# Patient Record
Sex: Male | Born: 1970 | Race: White | Hispanic: Refuse to answer | Marital: Single | Smoking: Former smoker
Health system: Southern US, Community
[De-identification: ages and names within clinical notes are randomized; demographics above are authoritative.]

## PROBLEM LIST (undated history)

## (undated) DIAGNOSIS — C61 Malignant neoplasm of prostate: Secondary | ICD-10-CM

## (undated) DIAGNOSIS — M1990 Unspecified osteoarthritis, unspecified site: Secondary | ICD-10-CM

## (undated) DIAGNOSIS — F1221 Cannabis dependence, in remission: Secondary | ICD-10-CM

## (undated) DIAGNOSIS — Z8489 Family history of other specified conditions: Secondary | ICD-10-CM

## (undated) DIAGNOSIS — J302 Other seasonal allergic rhinitis: Secondary | ICD-10-CM

## (undated) DIAGNOSIS — E7800 Pure hypercholesterolemia, unspecified: Secondary | ICD-10-CM

## (undated) DIAGNOSIS — I861 Scrotal varices: Secondary | ICD-10-CM

## (undated) DIAGNOSIS — F419 Anxiety disorder, unspecified: Secondary | ICD-10-CM

## (undated) HISTORY — DX: Anxiety disorder, unspecified: F41.9

## (undated) HISTORY — DX: Cannabis dependence, in remission: F12.21

## (undated) HISTORY — PX: ANAL FISSURE REPAIR: SHX2312

## (undated) HISTORY — DX: Scrotal varices: I86.1

## (undated) HISTORY — PX: HERNIA REPAIR: SHX51

## (undated) HISTORY — DX: Pure hypercholesterolemia, unspecified: E78.00

---

## 2000-08-18 ENCOUNTER — Ambulatory Visit (HOSPITAL_COMMUNITY): Admission: RE | Admit: 2000-08-18 | Discharge: 2000-08-18 | Payer: Self-pay | Admitting: Family Medicine

## 2000-08-18 ENCOUNTER — Encounter: Payer: Self-pay | Admitting: Family Medicine

## 2001-07-21 ENCOUNTER — Ambulatory Visit (HOSPITAL_BASED_OUTPATIENT_CLINIC_OR_DEPARTMENT_OTHER): Admission: RE | Admit: 2001-07-21 | Discharge: 2001-07-21 | Payer: Self-pay | Admitting: General Surgery

## 2003-08-05 ENCOUNTER — Emergency Department (HOSPITAL_COMMUNITY): Admission: AD | Admit: 2003-08-05 | Discharge: 2003-08-05 | Payer: Self-pay | Admitting: Family Medicine

## 2004-03-15 ENCOUNTER — Emergency Department (HOSPITAL_COMMUNITY): Admission: AD | Admit: 2004-03-15 | Discharge: 2004-03-15 | Payer: Self-pay | Admitting: Family Medicine

## 2004-12-27 ENCOUNTER — Emergency Department (HOSPITAL_COMMUNITY): Admission: EM | Admit: 2004-12-27 | Discharge: 2004-12-27 | Payer: Self-pay | Admitting: Family Medicine

## 2006-08-18 ENCOUNTER — Encounter: Admission: RE | Admit: 2006-08-18 | Discharge: 2006-08-18 | Payer: Self-pay | Admitting: Orthopedic Surgery

## 2006-09-20 ENCOUNTER — Ambulatory Visit (HOSPITAL_BASED_OUTPATIENT_CLINIC_OR_DEPARTMENT_OTHER): Admission: RE | Admit: 2006-09-20 | Discharge: 2006-09-20 | Payer: Self-pay | Admitting: General Surgery

## 2007-12-22 ENCOUNTER — Emergency Department (HOSPITAL_COMMUNITY): Admission: EM | Admit: 2007-12-22 | Discharge: 2007-12-22 | Payer: Self-pay | Admitting: Emergency Medicine

## 2010-09-19 NOTE — Op Note (Signed)
NAMEROBLEY, Terry Jenkins                   ACCOUNT NO.:  [id number]   MEDICAL RECORD NO.:  [id number]          PATIENT TYPE:  AMB   LOCATION:  DSC                          FACILITY:  [facility]   PHYSICIAN:  Cherylynn Ridges, M.D.    DATE OF BIRTH:  [birth year]   DATE OF PROCEDURE:  09/20/2006  DATE OF DISCHARGE:                               OPERATIVE REPORT   PREOPERATIVE DIAGNOSIS:  Left inguinal hernia.   POSTOPERATIVE DIAGNOSIS:  Left indirect inguinal hernia.   PROCEDURE:  Left inguinal hernia repair with mesh.   SURGEON:  Dr. Lindie Spruce   ASSISTANT:  None.   ANESTHESIA:  General with a laryngeal airway.   ESTIMATED BLOOD LOSS:  Less than 20 mL.   COMPLICATIONS:  None.   CONDITION:  Stable.   FINDINGS:  The patient had a small indirect hernia which was repaired,  small piece of mesh was implanted.   INDICATIONS FOR OPERATION:  The patient is a 40 year old who comes in  with a symptomatic left inguinal hernia for repair.   OPERATION:  The patient was taken to the operating room, placed on table  in supine position.  After an adequate general laryngeal airway  anesthetic was administered, he was prepped and draped in usual sterile  manner exposing the left inguinal area.  It should be noted at this time  that the patient had shaved himself prior to coming in for surgery.   Left transverse curvilinear incision about 5 cm long was taken down into  the subcutaneous tissue. Subcutaneous vein was ligated with 3-0 Vicryl  ties.  We took it down to and through Scarpa's fascia to the external  oblique fascia.  We split the fascia along its fibers through the  superficial ring and mobilized spermatic cord at the pubic tubercle.  Penrose drains used to mobilize and then subsequently we dissected out  the hernia sac which was in the anterior medial aspect of the spermatic  cord.  We were able to dissect out a 4 cm x 2 cm indirect sac which we  subsequently ligated x2 at its base with 0 Ethibond  suture with suture  ligature sutures.  Once this was done we implanted an antibiotic soaked  piece of polypropylene mesh measuring 2 x 4 cm in size, an oval piece  attaching it to the conjoined tendon anterior medially and the reflected  portion of inguinal ligament inferolaterally.  This was done with an 0  Prolene suture.  Once this was done we closed the external oblique  fascia on top of the spermatic cord with intact ilioinguinal nerve using  running 3-0 Vicryl stitch.  Scarpa's fascia was reapproximated using  interrupted 3-0 Vicryl  sutures.  We injected half percent Marcaine without epinephrine at the  site including a local block at the anterior-superior iliac spine, total  of 16 mL used.  We then closed the skin using running subcuticular  stitch of 4-0 Monocryl.  Sterile dressing was applied.  All needle,  sponge counts and instrument counts were correct.      Cherylynn Ridges, M.D.  Electronically Signed     JOW/MEDQ  D:  09/20/2006  T:  09/20/2006  Job:  [id number]   cc:   Caryn Bee L. Little, M.D.

## 2010-09-19 NOTE — Op Note (Signed)
[place]  Patient:    Terry Jenkins, Terry Jenkins Visit Number: [id number] MRN: [id number]          Service Type: DSU Location: [facility] Attending Physician:  Cherylynn Ridges Dictated by:   Jimmye Norman, M.D. Proc. Date: 07/21/01 Admit Date:  07/21/2001                             Operative Report  PREOPERATIVE DIAGNOSIS:  Posterior anal fissure with sentinel tag.  POSTOPERATIVE DIAGNOSIS:  Posterior anal fissure with sentinel tag with mucosal tear in the anterior lateral position on the left side at approximately 1 oclock.  PROCEDURE: 1. Examination under anesthesia. 2. Lateral sphincterotomy. 3. Excision of anal fissure.  SURGEON:  Jimmye Norman, M.D.  ASSISTANT:  None.  ANESTHESIA:  General with laryngeal airway.  ESTIMATED BLOOD LOSS:  20 to 30 cc.  COMPLICATIONS:  None.  CONDITION ON DISCHARGE:  Stable.  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  He now comes in for a lateral sphincterotomy.  FINDINGS:  The patient had a posterior anal fissure with some chronic scarring not into the sphincter which we were able to excise with minimal difficulty. The lateral sphincterotomy was okay.  He also had a new mucosal tear at approximately 1 to 2 oclock position with anterior being 12 oclock on the left side.  DESCRIPTION OF PROCEDURE:  The patient was taken to the operating room, placed on the table initially in the supine position.  After an adequate laryngeal airway anesthetic was administered, he was placed in lithotomy and then prepped with Betadine.  Using an anoscope, we were able to visualize the anal fissure which we subsequently incised with a #15 blade, and cauterized to obtain hemostasis. We reapproximated the freshened edges of the mucosa using a running locking stitch of 3-0 Vicryl out to the skin level.  There was  no further scarring, and we left the sphincter intact at that point.  On the left lateral side, we performed a longitudinal incision across the internal sphincter.  We subsequently isolated the sphincter using a hemostat clamp and then transected it using electrocautery.  We then reapproximated this mucosal partially up to sort of the dentate line using a running locking suture of 3-0 chromic.  If I used 3-0 Vicryl for the other stitch, actually we used 3-0 chromic for both repairs.  We irrigated with saline.  There was minimal bleeding.  Cautery was used sparingly.  We subsequently injected 0.25% Marcaine with epinephrine into the soft centric mucosal area, and then we placed Dibucaine cream soaked Gelfoam packing into the anus.  Dressing was applied.  All needle counts, sponge counts, and instrument counts were correct.  He was taken to the recovery room in stable condition. Dictated by:   Jimmye Norman, M.D. Attending Physician:  Cherylynn Ridges DD:  07/21/01 TD:  07/22/01 Job: [id number]

## 2012-08-10 ENCOUNTER — Encounter (HOSPITAL_BASED_OUTPATIENT_CLINIC_OR_DEPARTMENT_OTHER): Payer: Self-pay | Admitting: Emergency Medicine

## 2012-08-10 DIAGNOSIS — S0100XA Unspecified open wound of scalp, initial encounter: Secondary | ICD-10-CM | POA: Insufficient documentation

## 2012-08-10 DIAGNOSIS — Z23 Encounter for immunization: Secondary | ICD-10-CM | POA: Insufficient documentation

## 2012-08-10 DIAGNOSIS — Y9389 Activity, other specified: Secondary | ICD-10-CM | POA: Insufficient documentation

## 2012-08-10 DIAGNOSIS — Y9289 Other specified places as the place of occurrence of the external cause: Secondary | ICD-10-CM | POA: Insufficient documentation

## 2012-08-10 DIAGNOSIS — W208XXA Other cause of strike by thrown, projected or falling object, initial encounter: Secondary | ICD-10-CM | POA: Insufficient documentation

## 2012-08-10 DIAGNOSIS — Y990 Civilian activity done for income or pay: Secondary | ICD-10-CM | POA: Insufficient documentation

## 2012-08-10 DIAGNOSIS — F17200 Nicotine dependence, unspecified, uncomplicated: Secondary | ICD-10-CM | POA: Insufficient documentation

## 2012-08-10 NOTE — ED Notes (Signed)
Pt was hit in back of head by metal door at work. Pt denies LOC. Pt has small laceration to back of head

## 2012-08-11 ENCOUNTER — Emergency Department (HOSPITAL_BASED_OUTPATIENT_CLINIC_OR_DEPARTMENT_OTHER)
Admission: EM | Admit: 2012-08-11 | Discharge: 2012-08-11 | Disposition: A | Discharge status: Home / Self Care | Attending: Emergency Medicine | Admitting: Emergency Medicine

## 2012-08-11 DIAGNOSIS — S0101XA Laceration without foreign body of scalp, initial encounter: Secondary | ICD-10-CM

## 2012-08-11 MED ORDER — TETANUS-DIPHTH-ACELL PERTUSSIS 5-2.5-18.5 LF-MCG/0.5 IM SUSP
0.5000 mL | Freq: Once | INTRAMUSCULAR | Status: AC
  Administered 2012-08-11: 0.5 mL via INTRAMUSCULAR

## 2012-08-11 MED ORDER — TETANUS-DIPHTH-ACELL PERTUSSIS 5-2.5-18.5 LF-MCG/0.5 IM SUSP
INTRAMUSCULAR | Status: AC
  Filled 2012-08-11: qty 0.5

## 2012-08-11 MED ORDER — ACETAMINOPHEN 325 MG PO TABS
650.0000 mg | ORAL_TABLET | Freq: Once | ORAL | Status: AC
  Administered 2012-08-11: 650 mg via ORAL
  Filled 2012-08-11: qty 2

## 2012-08-11 NOTE — ED Notes (Signed)
MD at bedside. 

## 2012-08-11 NOTE — ED Provider Notes (Signed)
History     CSN: [id number]  Arrival date & time 08/10/12  2229   First MD Initiated Contact with Patient 08/11/12 0104      Chief Complaint  Patient presents with  . Head Injury    (Consider location/radiation/quality/duration/timing/severity/associated sxs/prior treatment) HPI This is a 42 year old male who was at work just prior to arrival when container door fell onto his head. He has a laceration to the occiput. Bleeding has been controlled with pressure. The wound was cleaned prior to my evaluation by his nurse. There was no loss of consciousness. He has not been nauseated or vomiting. He has a mild headache. He denies neck pain, back pain, chest pain or abdominal pain. He denies extremity injury.   History reviewed. No pertinent past medical history.  Past Surgical History  Procedure Laterality Date  . Hernia repair    . Anal fissure repair      No family history on file.  History  Substance Use Topics  . Smoking status: Current Some Day Smoker  . Smokeless tobacco: Not on file  . Alcohol Use: Yes      Review of Systems  All other systems reviewed and are negative.    Allergies  Ampicillin  Home Medications  No current outpatient prescriptions on file.  BP 140/88  Pulse 55  Temp(Src) 97.8 F (36.6 C) (Oral)  Resp 18  Ht 5\' 9"  (1.753 m)  Wt 165 lb (74.844 kg)  BMI 24.36 kg/m2  SpO2 100%  Physical Exam General: Well-developed, well-nourished male in no acute distress; appearance consistent with age of record HENT: normocephalic, laceration of the occiput without crepitus or deformity Eyes: pupils equal round and reactive to light; extraocular muscles intact Neck: supple Heart: regular rate and rhythm Lungs: clear to auscultation bilaterally Chest: Nontender Abdomen: soft; nondistended; nontender Extremities: No deformity; full range of motion Neurologic: Awake, alert and oriented; motor function intact in all extremities and symmetric; no facial  droop Skin: Warm and dry Psychiatric: Normal mood and affect    ED Course  Procedures (including critical care time)  LACERATION REPAIR Performed by: Ahyan Kreeger L Authorized by: Hanley Seamen Consent: Verbal consent obtained. Risks and benefits: risks, benefits and alternatives were discussed Consent given by: patient Patient identity confirmed: provided demographic data Prepped and Draped in normal sterile fashion Wound explored  Laceration Location: Occiput  Laceration Length: 2 cm  No Foreign Bodies seen or palpated  Anesthesia: none   Irrigation method: syringe Amount of cleaning: standard  Skin closure: Staples  Number of sutures: 3    Patient tolerance: Patient tolerated the procedure well with no immediate complications.    MDM          Hanley Seamen, MD 08/11/12 [phone]

## 2012-08-16 ENCOUNTER — Ambulatory Visit: Payer: Self-pay | Admitting: Sports Medicine

## 2012-08-17 ENCOUNTER — Encounter (HOSPITAL_BASED_OUTPATIENT_CLINIC_OR_DEPARTMENT_OTHER): Payer: Self-pay | Admitting: *Deleted

## 2012-08-17 DIAGNOSIS — F17200 Nicotine dependence, unspecified, uncomplicated: Secondary | ICD-10-CM | POA: Insufficient documentation

## 2012-08-17 DIAGNOSIS — Z4802 Encounter for removal of sutures: Secondary | ICD-10-CM | POA: Insufficient documentation

## 2012-08-17 NOTE — ED Provider Notes (Signed)
History     CSN: [id number]  Arrival date & time 08/17/12  2352   First MD Initiated Contact with Patient 08/17/12 2356      Chief Complaint  Patient presents with  . Suture / Staple Removal    (Consider location/radiation/quality/duration/timing/severity/associated sxs/prior treatment) Patient is a 42 y.o. male presenting with suture removal. The history is provided by the patient.  Suture / Staple Removal  The sutures were placed 7 to 10 days ago. There has been no treatment since the wound repair. Fever duration: none. There has been no drainage from the wound. There is no redness present. There is no swelling present. The pain has no pain. He has no difficulty moving the affected extremity or digit.    History reviewed. No pertinent past medical history.  Past Surgical History  Procedure Laterality Date  . Hernia repair    . Anal fissure repair      History reviewed. No pertinent family history.  History  Substance Use Topics  . Smoking status: Current Some Day Smoker  . Smokeless tobacco: Not on file  . Alcohol Use: Yes      Review of Systems  All other systems reviewed and are negative.    Allergies  Ampicillin  Home Medications  No current outpatient prescriptions on file.  Pulse 70  Resp 16  SpO2 100%  Physical Exam  Constitutional: He is oriented to person, place, and time. He appears well-developed and well-nourished. No distress.  HENT:  Mouth/Throat: Oropharynx is clear and moist.  Wound cdi  Eyes: Conjunctivae are normal. Pupils are equal, round, and reactive to light.  Neck: Normal range of motion. Neck supple.  Cardiovascular: Normal rate and regular rhythm.   Pulmonary/Chest: Effort normal and breath sounds normal.  Abdominal: Soft. Bowel sounds are normal. There is no tenderness.  Musculoskeletal: Normal range of motion.  Neurological: He is alert and oriented to person, place, and time.  Skin: Skin is warm and dry.  Psychiatric: He  has a normal mood and affect.    ED Course  Procedures (including critical care time)  Labs Reviewed - No data to display No results found.   1. Removal of staple       MDM  Staple removal        Charday Capetillo K Shahzain Kiester-Rasch, MD 08/18/12 0000

## 2012-08-17 NOTE — ED Notes (Signed)
Here for staple removal

## 2012-08-18 ENCOUNTER — Emergency Department (HOSPITAL_BASED_OUTPATIENT_CLINIC_OR_DEPARTMENT_OTHER)
Admission: EM | Admit: 2012-08-18 | Discharge: 2012-08-18 | Disposition: A | Discharge status: Home / Self Care | Attending: Emergency Medicine | Admitting: Emergency Medicine

## 2012-08-18 DIAGNOSIS — Z4802 Encounter for removal of sutures: Secondary | ICD-10-CM

## 2012-09-06 ENCOUNTER — Ambulatory Visit: Payer: Self-pay | Admitting: Sports Medicine

## 2012-11-29 ENCOUNTER — Emergency Department
Admission: EM | Admit: 2012-11-29 | Discharge: 2012-11-29 | Disposition: A | Discharge status: Home / Self Care | Payer: Federal, State, Local not specified - PPO | Source: Home / Self Care | Attending: Family Medicine | Admitting: Family Medicine

## 2012-11-29 ENCOUNTER — Encounter: Payer: Self-pay | Admitting: *Deleted

## 2012-11-29 DIAGNOSIS — J029 Acute pharyngitis, unspecified: Secondary | ICD-10-CM

## 2012-11-29 MED ORDER — AZITHROMYCIN 250 MG PO TABS: ORAL_TABLET | ORAL | Status: DC

## 2012-11-29 NOTE — ED Provider Notes (Signed)
  CSN: [id number]     Arrival date & time 11/29/12  1444 History     First MD Initiated Contact with Patient 11/29/12 1511     Chief Complaint  Patient presents with  . Sore Throat  . Fever  . Generalized Body Aches     HPI Comments: Patient awoke yesterday morning with sore throat, myalgias, fatigue, and headache.  This morning he had a fever to 101.  No nasal congestion or cough.   History reviewed. No pertinent past medical history. Past Surgical History  Procedure Laterality Date  . Hernia repair    . Anal fissure repair     Family History  Problem Relation Age of Onset  . Hypertension Mother    History  Substance Use Topics  . Smoking status: Former Games developer  . Smokeless tobacco: Not on file  . Alcohol Use: Yes    Review of Systems + sore throat No cough No pleuritic pain No wheezing No nasal congestion ? post-nasal drainage No sinus pain/pressure No itchy/red eyes No earache No hemoptysis No SOB + fever, + chills + nausea No vomiting No abdominal pain No diarrhea No urinary symptoms No skin rashes + fatigue + myalgias + headache    Allergies  Ampicillin  Home Medications   Current Outpatient Rx  Name  Route  Sig  Dispense  Refill  . azithromycin (ZITHROMAX Z-PAK) 250 MG tablet      Take 2 tabs today; then begin one tab once daily for 4 more days.   6 each   0    BP 130/79  Pulse 76  Temp(Src) 100.4 F (38 C) (Oral)  Resp 18  Wt 162 lb (73.483 kg)  BMI 23.91 kg/m2  SpO2 97% Physical Exam Nursing notes and Vital Signs reviewed. Appearance:  Patient appears healthy, stated age, and in no acute distress Eyes:  Pupils are equal, round, and reactive to light and accomodation.  Extraocular movement is intact.  Conjunctivae are not inflamed  Ears:  Canals normal.  Tympanic membranes normal.  Nose:  Normal turbinates.  No sinus tenderness.   Pharynx:  Pharynx:  Erythematous and slightly swollen without obstruction.  Neck:  Supple.  Tender  enlarged anterior nodes.  Lungs:  Clear to auscultation.  Breath sounds are equal.  Heart:  Regular rate and rhythm without murmurs, rubs, or gallops.  Abdomen:  Nontender without masses or hepatosplenomegaly.  Bowel sounds are present.  No CVA or flank tenderness.  Extremities:  No edema.  No calf tenderness Skin:  No rash present.   ED Course   Procedures  none  Labs Reviewed  POCT RAPID STREP A (OFFICE) negative    1. Acute pharyngitis; ?false negative rapid strep    MDM  Throat culture pending.  Begin a Z-pack. Take Ibuprofen 200mg , 4 tabs every 8 hours with food.  Try warm salt water gargles. Followup with Family Doctor if not improved in one week.   Lattie Haw, MD 12/05/12 [phone]

## 2012-11-29 NOTE — ED Notes (Signed)
Pt c/o sore throat, fever, nausea, body aches, chills and HA x 1 day.

## 2012-11-30 LAB — STREP A DNA PROBE: GASP: NEGATIVE

## 2012-12-01 ENCOUNTER — Telehealth: Payer: Self-pay | Admitting: Emergency Medicine

## 2013-01-18 ENCOUNTER — Encounter (HOSPITAL_BASED_OUTPATIENT_CLINIC_OR_DEPARTMENT_OTHER): Payer: Self-pay | Admitting: Emergency Medicine

## 2013-01-18 ENCOUNTER — Emergency Department (HOSPITAL_BASED_OUTPATIENT_CLINIC_OR_DEPARTMENT_OTHER)
Admission: EM | Admit: 2013-01-18 | Discharge: 2013-01-18 | Disposition: A | Discharge status: Home / Self Care | Payer: Federal, State, Local not specified - PPO | Attending: Emergency Medicine | Admitting: Emergency Medicine

## 2013-01-18 DIAGNOSIS — J40 Bronchitis, not specified as acute or chronic: Secondary | ICD-10-CM | POA: Insufficient documentation

## 2013-01-18 DIAGNOSIS — R110 Nausea: Secondary | ICD-10-CM | POA: Insufficient documentation

## 2013-01-18 DIAGNOSIS — Z792 Long term (current) use of antibiotics: Secondary | ICD-10-CM | POA: Insufficient documentation

## 2013-01-18 DIAGNOSIS — Z87891 Personal history of nicotine dependence: Secondary | ICD-10-CM | POA: Insufficient documentation

## 2013-01-18 DIAGNOSIS — R51 Headache: Secondary | ICD-10-CM | POA: Insufficient documentation

## 2013-01-18 DIAGNOSIS — R197 Diarrhea, unspecified: Secondary | ICD-10-CM | POA: Insufficient documentation

## 2013-01-18 MED ORDER — DOXYCYCLINE HYCLATE 100 MG PO CAPS: 100.0000 mg | ORAL_CAPSULE | Freq: Two times a day (BID) | ORAL | Status: DC

## 2013-01-18 MED ORDER — IBUPROFEN 800 MG PO TABS: 800.0000 mg | ORAL_TABLET | Freq: Three times a day (TID) | ORAL | Status: DC

## 2013-01-18 NOTE — ED Provider Notes (Cosign Needed)
CSN: [id number]     Arrival date & time 01/18/13  1922 History   This chart was scribed for non-physician practitioner working Cherrie Distance, PA-C with Hilario Quarry, MD by Valera Castle, ED scribe. This patient was seen in room MH02/MH02 and the patient's care was started at 7:58 PM.    Chief Complaint  Patient presents with  . Cough    Patient is a 42 y.o. male presenting with cough. The history is provided by the patient. No language interpreter was used.  Cough  HPI Comments: Terry Jenkins is a 42 y.o. male who presents to the Emergency Department complaining of sudden, intermittent, heavy, unproductive cough, onset 2 days ago. He reports first noticing the symptoms while at work, and reports a water-like feeling in his chest. He reports associated mild headaches, nausea, and diarrhea. He states he has not taken any medication PTA. He also reports increased skin sensitivity, and states that this is a normal occurrence when he becomes ill. He states that he is prone to respiratory infections and that these symptoms are similar to his former illnesses. He reports having a negative strep test 1 month ago, and that he had been taking Ibuprofen and antibiotics   History reviewed. No pertinent past medical history. Past Surgical History  Procedure Laterality Date  . Hernia repair    . Anal fissure repair     Family History  Problem Relation Age of Onset  . Hypertension Mother    History  Substance Use Topics  . Smoking status: Former Games developer  . Smokeless tobacco: Not on file  . Alcohol Use: Yes    Review of Systems  Respiratory: Positive for cough.   All other systems reviewed and are negative.    Allergies  Ampicillin  Home Medications   Current Outpatient Rx  Name  Route  Sig  Dispense  Refill  . azithromycin (ZITHROMAX Z-PAK) 250 MG tablet      Take 2 tabs today; then begin one tab once daily for 4 more days.   6 each   0    Triage Vitals: BP 130/70  Pulse 74   Temp(Src) 98.4 F (36.9 C)  Resp 18  Ht 5\' 9"  (1.753 m)  Wt 165 lb (74.844 kg)  BMI 24.36 kg/m2  SpO2 100%  Physical Exam  Nursing note and vitals reviewed. Constitutional: He is oriented to person, place, and time. He appears well-developed and well-nourished. No distress.  HENT:  Head: Normocephalic and atraumatic.  Eyes: EOM are normal.  Neck: Neck supple. No tracheal deviation present.  Cardiovascular: Normal rate.   Pulmonary/Chest: Effort normal. No respiratory distress.  Musculoskeletal: Normal range of motion.  Neurological: He is alert and oriented to person, place, and time.  Skin: Skin is warm and dry.  Psychiatric: He has a normal mood and affect. His behavior is normal.    ED Course  Procedures (including critical care time)  DIAGNOSTIC STUDIES: Oxygen Saturation is 100% on room air, normal by my interpretation.    COORDINATION OF CARE: 7:58 PM-Discussed treatment plan which includes abx and supportive care     Labs Review Labs Reviewed - No data to display Imaging Review No results found.  MDM  Bronchitis   42 year old former smoker who presents with URI - patient with history of same - states usually gets abx - plan to give same - lungs clear here - sats normal - do not suspect CAP.  No chest pain so doubt PE.  I personally performed the services described in this documentation, which was scribed in my presence. The recorded information has been reviewed and is accurate.   Izola Price Marisue Humble, [place] 01/18/13 2043

## 2013-01-18 NOTE — ED Notes (Signed)
Pt c/o cough x 2 days and body aches with diarrhea today.

## 2013-01-18 NOTE — Discharge Instructions (Signed)

## 2013-02-01 NOTE — ED Provider Notes (Signed)
CSN: [id number]     Arrival date & time 01/18/13  1922 History   This chart was scribed for non-physician practitioner working Cherrie Distance, PA-C with Hilario Quarry, MD by Valera Castle, ED scribe. This patient was seen in room MH02/MH02 and the patient's care was started at 7:58 PM.    Chief Complaint  Patient presents with  . Cough    Patient is a 42 y.o. male presenting with cough. The history is provided by the patient. No language interpreter was used.  Cough Cough characteristics:  Non-productive Severity:  Moderate Onset quality:  Sudden Duration:  2 days Timing:  Intermittent Progression:  Worsening Chronicity:  Recurrent Smoker: Quit 09/2003.   Context comment:  Has h/o URI. Associated symptoms: no fever, no rhinorrhea, no shortness of breath, no sinus congestion and no sore throat    HPI Comments: LIVINGSTON DENNER is a 42 y.o. male who presents to the Emergency Department complaining of sudden, intermittent, heavy, unproductive cough, onset 2 days ago. He reports first noticing the symptoms while at work, and reports a water-like feeling in his chest. He reports the cough is worsened by laying down. He reports associated mild headaches, nausea, and diarrhea. He states he has not taken any medication PTA. He also reports increased skin sensitivity, and states that this is a normal occurrence when he becomes ill. He denies taking any medications PTA. He denies anyone around him being ill. He states that he is prone to respiratory infections and that these symptoms are similar to his former illnesses. He reports having a negative strep test 1 month ago, and that he had been taking Ibuprofen and antibiotics. He reports responding well to doxycycline. He denies SOB, chest pain, chronic cough, rhinorrhea, congestion, emesis, sore throat, fever, chills, numbness, or any other associated symptoms. He denies h/o asthma or COPD. He states he is a former smoker quitting on 03/2004. 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   PCP- Dr. Catha Gosselin [facility]   History reviewed. No pertinent past medical history. Past Surgical History  Procedure Laterality Date  . Hernia repair    . Anal fissure repair     Family History  Problem Relation Age of Onset  . Hypertension Mother    History  Substance Use Topics  . Smoking status: Former Games developer  . Smokeless tobacco: Not on file  . Alcohol Use: Yes    Review of Systems  Constitutional: Negative for fever.  HENT: Negative for sore throat and rhinorrhea.   Respiratory: Positive for cough. Negative for shortness of breath.   All other systems reviewed and are negative.    Allergies  Ampicillin  Home Medications   Current Outpatient Rx  Name  Route  Sig  Dispense  Refill  . azithromycin (ZITHROMAX Z-PAK) 250 MG tablet      Take 2 tabs today; then begin one tab once daily for 4 more days.   6 each   0    Triage Vitals: BP 130/70  Pulse 74  Temp(Src) 98.4 F (36.9 C)  Resp 18  Ht 5\' 9"  (1.753 m)  Wt 165 lb (74.844 kg)  BMI 24.36 kg/m2  SpO2 100%  Physical Exam  Nursing note and vitals reviewed. Constitutional: He is oriented to person, place, and time. He appears well-developed and well-nourished. No distress.  HENT:  Head: Normocephalic and atraumatic.  Eyes: EOM are normal.  Neck: Neck supple. No tracheal deviation present.  Cardiovascular: Normal rate, regular rhythm  and normal heart sounds.   Pulmonary/Chest: Effort normal and breath sounds normal. No respiratory distress. He has no wheezes. He has no rales.  Abdominal: Soft. There is no tenderness.  Musculoskeletal: Normal range of motion.  Neurological: He is alert and oriented to person, place, and time.  Skin: Skin is warm and dry.  Psychiatric: He has a normal mood and affect. His behavior is normal.    ED Course  Procedures (including critical care time)  DIAGNOSTIC STUDIES: Oxygen Saturation is 100% on room air, normal by my  interpretation.    COORDINATION OF CARE: 7:58 PM-Discussed treatment plan which includes abx and supportive care     Labs Review Labs Reviewed - No data to display Imaging Review No results found.  MDM  Bronchitis   42 year old former smoker who presents with URI - patient with history of same - states usually gets abx - plan to give same - lungs clear here - sats normal - do not suspect CAP.  No chest pain so doubt PE.      I personally performed the services described in this documentation, which was scribed in my presence. The recorded information has been reviewed and is accurate.   Izola Price Marisue Humble, PA-C 01/18/13 2043   Izola Price. Marisue Humble, [place] 02/05/13 2207

## 2013-02-07 NOTE — ED Provider Notes (Signed)
History/physical exam/procedure(s) were performed by non-physician practitioner and as supervising physician I was immediately available for consultation/collaboration. I have reviewed all notes and am in agreement with care and plan.   Hilario Quarry, MD 02/07/13 [phone]

## 2013-06-06 ENCOUNTER — Ambulatory Visit: Payer: Federal, State, Local not specified - PPO | Attending: Family Medicine | Admitting: Physical Therapy

## 2013-06-06 DIAGNOSIS — M25559 Pain in unspecified hip: Secondary | ICD-10-CM | POA: Insufficient documentation

## 2013-06-06 DIAGNOSIS — IMO0001 Reserved for inherently not codable concepts without codable children: Secondary | ICD-10-CM | POA: Insufficient documentation

## 2013-06-06 DIAGNOSIS — M7150 Other bursitis, not elsewhere classified, unspecified site: Secondary | ICD-10-CM | POA: Insufficient documentation

## 2013-06-06 DIAGNOSIS — M25659 Stiffness of unspecified hip, not elsewhere classified: Secondary | ICD-10-CM | POA: Insufficient documentation

## 2013-06-06 DIAGNOSIS — M6281 Muscle weakness (generalized): Secondary | ICD-10-CM | POA: Insufficient documentation

## 2013-06-06 DIAGNOSIS — R293 Abnormal posture: Secondary | ICD-10-CM | POA: Insufficient documentation

## 2013-06-13 ENCOUNTER — Ambulatory Visit: Payer: Federal, State, Local not specified - PPO | Admitting: Physical Therapy

## 2013-06-27 ENCOUNTER — Ambulatory Visit: Payer: Federal, State, Local not specified - PPO | Admitting: Rehabilitation

## 2013-07-04 ENCOUNTER — Ambulatory Visit (INDEPENDENT_AMBULATORY_CARE_PROVIDER_SITE_OTHER): Payer: Federal, State, Local not specified - PPO | Admitting: Sports Medicine

## 2013-07-04 ENCOUNTER — Encounter: Payer: Self-pay | Admitting: Sports Medicine

## 2013-07-04 VITALS — BP 140/74 | HR 65 | Ht 69.0 in | Wt 161.0 lb

## 2013-07-04 DIAGNOSIS — M76899 Other specified enthesopathies of unspecified lower limb, excluding foot: Secondary | ICD-10-CM

## 2013-07-04 DIAGNOSIS — M7071 Other bursitis of hip, right hip: Secondary | ICD-10-CM | POA: Insufficient documentation

## 2013-07-04 NOTE — Patient Instructions (Signed)
Initial Hamstring Rehab Protocol Hamstring curls: Start with 3 sets of 15 (no weight); Progress by 5 reps every 3 days until you reach 3 sets of 30; After 3 days at 3 sets of 30, add 2lb ankle weight at 3 sets of 10; Increase every 5 days by 5 reps. You may add 2lbs ankle weight once weekly. Hamstring swings- swing leg backwards and curl at the end of the swing. Follow same schedule as above. Hamstring running lunges- running lunge position means no more than 45 degrees of knee flexion and running motion. Follow same schedule as above.  Advanced Hamstring Rehab Protocol Plyometrics: bound and land in running position; 3 sets of 15 level first; then up a rise; then down a rise; only advance when level is easy. No greater than 45 deg knee flexion. Stride drills- 10 x 75 yards; bounding but no more than 45 deg knee flexion. Level for 1 week; then add up hill for 1 week; then add down hill. Hop drills- 15 repetitive hops- 3 sets; compare injured to non injured leg Nordic hamstring exercises- stabilize body kneeling;1 set of 5-10 and progress slowly(Don't attempt if painful) 

## 2013-07-04 NOTE — Assessment & Plan Note (Signed)
Ultrasound guided injection into the ischial bursa as above. Aggressive eccentric hamstring rehabilitation. Over-the-counter NSAIDs as needed, continue chiropractic care. Return to see me in one month.

## 2013-07-04 NOTE — Progress Notes (Signed)
   Subjective:    I'm seeing this patient as a consultation for:  Dr. Hulan Fess, Dr. Johnston Ebbs  CC: Right buttock pain  HPI: This is a very pleasant 43 year old male, he is a power lifter. For the past several months he's noted pain that he localizes in the posterior aspect of his right buttock directly over the ischial tuberosity. Pain is moderate, persistent, without radiation. He only feels it when sitting, and with forceful dead lifts. He denies any other mechanical symptoms, no back pain, no radicular symptoms. He was seen by an orthopedist who recommended an MRI be done first, , he was not very happy with the visit.  Past medical history, Surgical history, Family history not pertinant except as noted below, Social history, Allergies, and medications have been entered into the medical record, reviewed, and no changes needed.   Review of Systems: No headache, visual changes, nausea, vomiting, diarrhea, constipation, dizziness, abdominal pain, skin rash, fevers, chills, night sweats, weight loss, swollen lymph nodes, body aches, joint swelling, muscle aches, chest pain, shortness of breath, mood changes, visual or auditory hallucinations.   Objective:   General: Well Developed, well nourished, and in no acute distress.  Neuro/Psych: Alert and oriented x3, extra-ocular muscles intact, able to move all 4 extremities, sensation grossly intact. Skin: Warm and dry, no rashes noted.  Respiratory: Not using accessory muscles, speaking in full sentences, trachea midline.  Cardiovascular: Pulses palpable, no extremity edema. Abdomen: Does not appear distended. Right Hip: ROM IR: 45 Deg, ER: 45 Deg, Flexion: 120 Deg, Extension: 100 Deg, Abduction: 45 Deg, Adduction: 45 Deg Strength IR: 5/5, ER: 5/5, Flexion: 5/5, Extension: 5/5, Abduction: 5/5, Adduction: 5/5 Pelvic alignment unremarkable to inspection and palpation. Standing hip rotation and gait without trendelenburg sign /  unsteadiness. Greater trochanter without tenderness to palpation. There is moderate tenderness to palpation over the ischial tuberosity. There is no reproduction of pain with resisted knee flexion or hip extension. No tenderness over piriformis. No pain with FABER or FADIR. No SI joint tenderness and normal minimal SI movement.  Procedure: Real-time Ultrasound Guided Injection of right ischial bursa Device: GE Logiq E  Verbal informed consent obtained.  Time-out conducted.  Noted no overlying erythema, induration, or other signs of local infection.  Skin prepped in a sterile fashion.  Local anesthesia: Topical Ethyl chloride.  With sterile technique and under real time ultrasound guidance:  Spinal needle advanced to the ischial tuberosity, bounced off of bone just deep to the hamstring origin, a total of 1 cc Kenalog 40, 4 cc lidocaine injected. Completed without difficulty  Pain immediately resolved suggesting accurate placement of the medication.  Advised to call if fevers/chills, erythema, induration, drainage, or persistent bleeding.  Images permanently stored and available for review in the ultrasound unit.  Impression: Technically successful ultrasound guided injection.  Impression and Recommendations:   This case required medical decision making of moderate complexity.

## 2013-07-10 ENCOUNTER — Ambulatory Visit: Payer: Federal, State, Local not specified - PPO | Admitting: Rehabilitation

## 2013-07-11 ENCOUNTER — Ambulatory Visit: Payer: Federal, State, Local not specified - PPO | Attending: Family Medicine | Admitting: Physical Therapy

## 2013-07-11 DIAGNOSIS — M6281 Muscle weakness (generalized): Secondary | ICD-10-CM | POA: Insufficient documentation

## 2013-07-11 DIAGNOSIS — M25659 Stiffness of unspecified hip, not elsewhere classified: Secondary | ICD-10-CM | POA: Insufficient documentation

## 2013-07-11 DIAGNOSIS — R293 Abnormal posture: Secondary | ICD-10-CM | POA: Insufficient documentation

## 2013-07-11 DIAGNOSIS — M25559 Pain in unspecified hip: Secondary | ICD-10-CM | POA: Insufficient documentation

## 2013-07-11 DIAGNOSIS — M7150 Other bursitis, not elsewhere classified, unspecified site: Secondary | ICD-10-CM | POA: Insufficient documentation

## 2013-08-01 ENCOUNTER — Ambulatory Visit: Payer: Self-pay | Admitting: Sports Medicine

## 2013-08-02 ENCOUNTER — Ambulatory Visit (INDEPENDENT_AMBULATORY_CARE_PROVIDER_SITE_OTHER): Payer: Federal, State, Local not specified - PPO | Admitting: Sports Medicine

## 2013-08-02 ENCOUNTER — Encounter: Payer: Self-pay | Admitting: Sports Medicine

## 2013-08-02 VITALS — BP 133/79 | HR 61 | Ht 69.0 in | Wt 159.0 lb

## 2013-08-02 DIAGNOSIS — M7071 Other bursitis of hip, right hip: Secondary | ICD-10-CM

## 2013-08-02 DIAGNOSIS — M76899 Other specified enthesopathies of unspecified lower limb, excluding foot: Secondary | ICD-10-CM

## 2013-08-02 NOTE — Assessment & Plan Note (Signed)
Resolved with injection and physical therapy. I have recommended avoiding active release therapy directly over his ischial tuberosity. Continue weightlifting but avoid dead lift from the ground, and avoid early rapid acceleration. Return as needed.

## 2013-08-02 NOTE — Progress Notes (Signed)
  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 He had been through some physical therapy as well as active release therapy by his chiropractor but had not improved. Performed an injection into the ischial bursa, and I advised specific rehabilitation exercises. He returns pain-free. He does note that when doing dead lift, if he starts from the floor and accelerates to rapidly from the beginning he has a recurrence of pain. I have advised to start on the right at approximately knee level, and avoiding initial rapid acceleration.  Past medical history, Surgical history, Family history not pertinant except as noted below, Social history, Allergies, and medications have been entered into the medical record, reviewed, and no changes needed.   Review of Systems: No fevers, chills, night sweats, weight loss, chest pain, or shortness of breath.   Objective:    General: Well Developed, well nourished, and in no acute distress.  Neuro: Alert and oriented x3, extra-ocular muscles intact, sensation grossly intact.  HEENT: Normocephalic, atraumatic, pupils equal round reactive to light, neck supple, no masses, no lymphadenopathy, thyroid nonpalpable.  Skin: Warm and dry, no rashes. Cardiac: Regular rate and rhythm, no murmurs rubs or gallops, no lower extremity edema.  Respiratory: Clear to auscultation bilaterally. Not using accessory muscles, speaking in full sentences. Right Hip: ROM IR: 45 Deg, ER: 45 Deg, Flexion: 120 Deg, Extension: 100 Deg, Abduction: 45 Deg, Adduction: 45 Deg Strength IR: 5/5, ER: 5/5, Flexion: 5/5, Extension: 5/5, Abduction: 5/5, Adduction: 5/5 Pelvic alignment unremarkable to inspection and palpation. Standing hip rotation and gait without trendelenburg sign / unsteadiness. Greater trochanter without tenderness to palpation. No tenderness over piriformis. No pain with FABER or FADIR. No SI joint tenderness and normal  minimal SI movement.  Impression and Recommendations:

## 2013-10-24 ENCOUNTER — Emergency Department (INDEPENDENT_AMBULATORY_CARE_PROVIDER_SITE_OTHER): Payer: Federal, State, Local not specified - PPO

## 2013-10-24 ENCOUNTER — Emergency Department
Admission: EM | Admit: 2013-10-24 | Discharge: 2013-10-24 | Disposition: A | Discharge status: Home / Self Care | Payer: Federal, State, Local not specified - PPO | Source: Home / Self Care

## 2013-10-24 ENCOUNTER — Encounter: Payer: Self-pay | Admitting: Emergency Medicine

## 2013-10-24 DIAGNOSIS — S6010XA Contusion of unspecified finger with damage to nail, initial encounter: Secondary | ICD-10-CM

## 2013-10-24 DIAGNOSIS — S6991XA Unspecified injury of right wrist, hand and finger(s), initial encounter: Secondary | ICD-10-CM

## 2013-10-24 DIAGNOSIS — M79609 Pain in unspecified limb: Secondary | ICD-10-CM

## 2013-10-24 DIAGNOSIS — S6000XA Contusion of unspecified finger without damage to nail, initial encounter: Secondary | ICD-10-CM

## 2013-10-24 MED ORDER — HYDROCODONE-ACETAMINOPHEN 7.5-325 MG PO TABS: 1.0000 | ORAL_TABLET | Freq: Three times a day (TID) | ORAL | Status: DC | PRN

## 2013-10-24 MED ORDER — DOXYCYCLINE HYCLATE 100 MG PO TABS: 100.0000 mg | ORAL_TABLET | Freq: Two times a day (BID) | ORAL | Status: AC

## 2013-10-24 NOTE — Discharge Instructions (Signed)
Subungual Hematoma °A subungual hematoma is a pocket of blood that collects under the fingernail or toenail. The pressure created by the blood under the nail can cause pain. °CAUSES  °A subungual hematoma occurs when an injury to the finger or toe causes a blood vessel beneath the nail to break. The injury can occur from a direct blow such as slamming a finger in a door. It can also occur from a repeated injury such as pressure on the foot in a shoe while running. A subungual hematoma is sometimes called runner's toe or tennis toe. °SYMPTOMS  °· Blue or dark blue skin under the nail. °· Pain or throbbing in the injured area. °DIAGNOSIS  °Your caregiver can determine whether you have a subungual hematoma based on your history and a physical exam. If your caregiver thinks you might have a broken (fractured) bone, X-rays may be taken. °TREATMENT  °Hematomas usually go away on their own over time. Your caregiver may make a hole in the nail to drain the blood. Draining the blood is painless and usually provides significant relief from pain and throbbing. The nail usually grows back normally after this procedure. In some cases, the nail may need to be removed. This is done if there is a cut under the nail that requires stitches (sutures). °HOME CARE INSTRUCTIONS  °· Put ice on the injured area. °¨ Put ice in a plastic bag. °¨ Place a towel between your skin and the bag. °¨ Leave the ice on for 15-20 minutes, 03-04 times a day for the first 1 to 2 days. °· Elevate the injured area to help decrease pain and swelling. °· If you were given a bandage, wear it for as long as directed by your caregiver. °· If part of your nail falls off, trim the remaining nail gently. This prevents the nail from catching on something and causing further injury. °· Only take over-the-counter or prescription medicines for pain, discomfort, or fever as directed by your caregiver. °SEEK IMMEDIATE MEDICAL CARE IF:  °· You have redness or swelling  around the nail. °· You have yellowish-white fluid (pus) coming from the nail. °· Your pain is not controlled with medicine. °· You have a fever. °MAKE SURE YOU: °· Understand these instructions. °· Will watch your condition. °· Will get help right away if you are not doing well or get worse. °Document Released: 04/17/2000 Document Revised: 07/13/2011 Document Reviewed: 04/08/2011 °[employer]® Patient Information ©2015 [employer]. This information is not intended to replace advice given to you by your health care provider. Make sure you discuss any questions you have with your health care provider. ° °

## 2013-10-24 NOTE — ED Notes (Signed)
Pt reports that he "smashed" his RT thumb last night. No OTC meds.

## 2013-10-24 NOTE — ED Provider Notes (Signed)
CSN: [id number]     Arrival date & time 10/24/13  0802 History   None    Chief Complaint  Patient presents with  . Finger Injury   (Consider location/radiation/quality/duration/timing/severity/associated sxs/prior Treatment) HPI Pt is a 43 yo male who presents to the clinic after smashing his right thumb with heavy machinary last night. Pain is 8/10, constant and throbbing. He tried to heat up a paperclip and drain himself but just didn't feel like it was working. Not tried anything else.  History reviewed. No pertinent past medical history. Past Surgical History  Procedure Laterality Date  . Hernia repair    . Anal fissure repair     Family History  Problem Relation Age of Onset  . Hypertension Mother    History  Substance Use Topics  . Smoking status: Former Research scientist (life sciences)  . Smokeless tobacco: Not on file  . Alcohol Use: Yes    Review of Systems  All other systems reviewed and are negative.   Allergies  Ampicillin  Home Medications   Prior to Admission medications   Medication Sig Start Date End Date Taking? Authorizing Provider  doxycycline (VIBRA-TABS) 100 MG tablet Take 1 tablet (100 mg total) by mouth 2 (two) times daily. For 10 days. 10/24/13 10/31/13  Donella Stade, PA-C  HYDROcodone-acetaminophen (NORCO) 7.5-325 MG per tablet Take 1 tablet by mouth every 8 (eight) hours as needed. 10/24/13   Jade L Breeback, PA-C   BP 132/86  Pulse 56  Temp(Src) 98.1 F (36.7 C) (Oral)  Resp 16  Ht 5\' 9"  (1.753 m)  Wt 166 lb (75.297 kg)  BMI 24.50 kg/m2  SpO2 99% Physical Exam  Constitutional: He appears well-developed and well-nourished.  Musculoskeletal:       Hands:   ED Course  Procedures (including critical care time) Labs Review Labs Reviewed - No data to display  Imaging Review Dg Finger Thumb Right  10/24/2013   CLINICAL DATA:  43 year old male with thumb pain and bruising after crush injury. Initial encounter.  EXAM: RIGHT THUMB 2+V  COMPARISON:  None.   FINDINGS: Bone mineralization is within normal limits. Joint spaces and alignment in the visible right hand and wrist appear preserved. The first metacarpal and phalanges appear intact. Joint spaces are preserved. No acute fracture identified. No radiopaque foreign body identified.  IMPRESSION: No acute fracture or dislocation identified about the right thumb.   Electronically Signed   By: Lars Pinks M.D.   On: 10/24/2013 08:41     MDM   1. Subungual hematoma of finger, initial encounter   2. Thumbnail injury, right, initial encounter    Xray was negative for any fracture.  Discussed with patient swelling is likely from soft tissue injury.  Due to soft tissue swelling around cuticle and redness. Printed off doxycycline rx and told to use if noticed signs of infection.  Fine tip cautery was used to place a hole in the nail and relieve pressure. Pt did report relief from some pain while in office today.  Pt was given ice pack with instructions to rest for 24 hours. Note for work was given.  Thumb was covered with gauze and compression coban.  norco was given for pain.  Discussed ibuprofen for swelling and inflammation can also help.       Donella Stade, PA-C 10/24/13 [place], PA-C 10/24/13 [phone]

## 2013-10-25 NOTE — ED Provider Notes (Signed)
Agree with exam, assessment, and plan.   Kandra Nicolas, MD 10/25/13 1401

## 2014-07-27 ENCOUNTER — Emergency Department
Admission: EM | Admit: 2014-07-27 | Discharge: 2014-07-27 | Disposition: A | Discharge status: Home / Self Care | Payer: Federal, State, Local not specified - PPO | Source: Home / Self Care | Attending: Emergency Medicine | Admitting: Emergency Medicine

## 2014-07-27 ENCOUNTER — Encounter: Payer: Self-pay | Admitting: Emergency Medicine

## 2014-07-27 DIAGNOSIS — S61412A Laceration without foreign body of left hand, initial encounter: Secondary | ICD-10-CM | POA: Diagnosis not present

## 2014-07-27 DIAGNOSIS — S6992XA Unspecified injury of left wrist, hand and finger(s), initial encounter: Secondary | ICD-10-CM

## 2014-07-27 MED ORDER — SULFAMETHOXAZOLE-TRIMETHOPRIM 800-160 MG PO TABS: 1.0000 | ORAL_TABLET | Freq: Two times a day (BID) | ORAL | Status: DC

## 2014-07-27 NOTE — ED Provider Notes (Signed)
CSN: [id number]     Arrival date & time 07/27/14  1116 History   First MD Initiated Contact with Patient 07/27/14 1124     Chief Complaint  Patient presents with  . Extremity Laceration    HPI Pt states he cut his left hand on a sharp axe blade late last night. . Last tetanus shot x2 years ago.  Minimally painful 3 cm full skin thickness laceration dorsum L hand.. Bleeding stopped last night with direct pressure after he cleaned the wound. . No other associated symptoms.Denies weakness or numbness. Denies head injury, neck pain, chest pain, shortness of breath, abdominal pain, nausea, diarrhea. Denies fever or chills History reviewed. No pertinent past medical history. Past Surgical History  Procedure Laterality Date  . Hernia repair    . Anal fissure repair     Family History  Problem Relation Age of Onset  . Hypertension Mother    History  Substance Use Topics  . Smoking status: Former Research scientist (life sciences)  . Smokeless tobacco: Not on file  . Alcohol Use: Yes    Review of Systems  All other systems reviewed and are negative.   Allergies  Ampicillin  Home Medications   Prior to Admission medications   Medication Sig Start Date End Date Taking? Authorizing Provider  sulfamethoxazole-trimethoprim (SEPTRA DS) 800-160 MG per tablet Take 1 tablet by mouth 2 (two) times daily. X 7 days (antibiotic) 07/27/14   Jacqulyn Cane, MD   BP 145/75 mmHg  Pulse 68  Temp(Src) 97.4 F (36.3 C) (Oral)  SpO2 100% Physical Exam  Constitutional: He is oriented to person, place, and time. He appears well-developed and well-nourished. No distress.  HENT:  Head: Normocephalic and atraumatic.  Eyes: Conjunctivae and EOM are normal. Pupils are equal, round, and reactive to light. No scleral icterus.  Neck: Normal range of motion.  Cardiovascular: Normal rate.   Pulmonary/Chest: Effort normal.  Abdominal: He exhibits no distension.  Musculoskeletal: Normal range of motion.  Dorsum of left hand, there  is a gaping 3 cm straight laceration, extending to the subcutaneous tissues. Tendons, function, range of motion intact. Motor and sensory intact. Neurovascular distally intact. Normal capillary refill distally.  Neurological: He is alert and oriented to person, place, and time.  Skin: Skin is warm.  Psychiatric: He has a normal mood and affect.  Nursing note and vitals reviewed.   3 cm full skin thickness laceration dorsum left hand Tendons and neurovascular intact. No bony tenderness. No foreign body seen ED Course  LACERATION REPAIR Date/Time: 07/27/2014 12:31 PM Performed by: Burnett Harry, DAVID Authorized by: Burnett Harry, DAVID Consent: Verbal consent obtained. Risks and benefits: risks, benefits and alternatives were discussed Consent given by: patient Patient understanding: patient states understanding of the procedure being performed Patient identity confirmed: verbally with patient Time out: Immediately prior to procedure a "time out" was called to verify the correct patient, procedure, equipment, support staff and site/side marked as required. Body area: upper extremity (Dorsum left hand) Laceration length: 3 cm Contaminated: No. Tendon involvement: none Nerve involvement: none Vascular damage: no Anesthesia: local infiltration Local anesthetic: lidocaine 2% without epinephrine Anesthetic total: 3 ml Preparation: Patient was prepped and draped in the usual sterile fashion. Irrigation solution: saline Irrigation method: jet lavage Amount of cleaning: standard Debridement: none Skin closure: 4-0 nylon Number of sutures: 7 Approximation: close Approximation difficulty: simple Dressing: non-adhesive packing strip and gauze roll (And Coban) Patient tolerance: Patient tolerated the procedure well with no immediate complications   Imaging Review No results found.  MDM   1. Laceration of left hand, initial encounter   2. Injury of left hand, initial encounter    No  evidence of bony or tendinous involvement. Although the laceration occurred last night, in my opinion, I feel the benefits of repair outweigh the risks, because of how deep and gaping the wound is. There is no foreign body, no sign of infection. Laceration repaired, see details above. 7 4-0 nylon interrupted sutures, with excellent approximation and hemostasis. Wound aftercare discussed Return in 12-14 days for suture removal, sooner prn. Red flags discussed Antibiotic prescribed. He is allergic to ampicillin, so after discussion with patient, I prescribed Septra DS twice a day 7 days. This will have excellent antibacterial coverage, including coverage for MRSA. No gripping or lifting left hand for 7 days.--He states he may need a note for this. This was indicated in AVS, which was printed and given to patient. He declined any prescription pain meds. Advised Tylenol or ibuprofen if needed for pain. Questions invited and answered. He voiced understanding and agreement.  Jacqulyn Cane, MD 07/27/14 Kathyrn Drown

## 2014-07-27 NOTE — ED Notes (Signed)
Pt states he cut his left hand on an axe blade late last night. . Last tetanus shot x2 years ago.

## 2014-07-27 NOTE — Discharge Instructions (Signed)
Laceration Care, Adult A laceration is a cut or lesion that goes through all layers of the skin and into the tissue just beneath the skin. TREATMENT  Some lacerations may not require closure. Some lacerations may not be able to be closed due to an increased risk of infection. It is important to see your caregiver as soon as possible after an injury to minimize the risk of infection and maximize the opportunity for successful closure. If closure is appropriate, pain medicines may be given, if needed. The wound will be cleaned to help prevent infection. Your caregiver will use stitches (sutures), staples, wound glue (adhesive), or skin adhesive strips to repair the laceration. These tools bring the skin edges together to allow for faster healing and a better cosmetic outcome. However, all wounds will heal with a scar. Once the wound has healed, scarring can be minimized by covering the wound with sunscreen during the day for 1 full year. HOME CARE INSTRUCTIONS  For sutures or staples:  Keep the wound clean and dry.  If you were given a bandage (dressing), you should change it at least once a day. Also, change the dressing if it becomes wet or dirty, or as directed by your caregiver.  Wash the wound with soap and water 2 times a day. Rinse the wound off with water to remove all soap. Pat the wound dry with a clean towel.  After cleaning, apply a thin layer of the antibiotic ointment as recommended by your caregiver. This will help prevent infection and keep the dressing from sticking.  You may shower as usual after the first 24 hours. Do not soak the wound in water until the sutures are removed.  Only take over-the-counter or prescription medicines for pain, discomfort, or fever as directed by your caregiver. Get your sutures removed in 12-14 days  You may need a tetanus shot if:  You cannot remember when you had your last tetanus shot.  You have never had a tetanus shot.  No tetanus shot  needed as last one 2 years ago If you get a tetanus shot, your arm may swell, get red, and feel warm to the touch. This is common and not a problem. If you need a tetanus shot and you choose not to have one, there is a rare chance of getting tetanus. Sickness from tetanus can be serious. SEEK MEDICAL CARE IF:   You have redness, swelling, or increasing pain in the wound.  You see a red line that goes away from the wound.  You have yellowish-white fluid (pus) coming from the wound.  You have a fever.  You notice a bad smell coming from the wound or dressing.  Your wound breaks open before or after sutures have been removed.  You notice something coming out of the wound such as wood or glass.  Your wound is on your hand or foot and you cannot move a finger or toe. SEEK IMMEDIATE MEDICAL CARE IF:   Your pain is not controlled with prescribed medicine.  You have severe swelling around the wound causing pain and numbness or a change in color in your arm, hand, leg, or foot.  Your wound splits open and starts bleeding.  You have worsening numbness, weakness, or loss of function of any joint around or beyond the wound.  You develop painful lumps near the wound or on the skin anywhere on your body. MAKE SURE YOU:   Understand these instructions.  Will watch your condition.  Will get  help right away if you are not doing well or get worse. Document Released: 04/20/2005 Document Revised: 07/13/2011 Document Reviewed: 10/14/2010 [facility] Patient Information 2015 [place], [place]. This information is not intended to replace advice given to you by your health care provider. Make sure you discuss any questions you have with your health care provider. No lifting with left hand or heavy gripping with left hand for 7 days

## 2014-08-08 ENCOUNTER — Emergency Department (INDEPENDENT_AMBULATORY_CARE_PROVIDER_SITE_OTHER)
Admission: EM | Admit: 2014-08-08 | Discharge: 2014-08-08 | Disposition: A | Discharge status: Home / Self Care | Payer: Federal, State, Local not specified - PPO | Source: Home / Self Care | Attending: Family Medicine | Admitting: Family Medicine

## 2014-08-08 ENCOUNTER — Encounter: Payer: Self-pay | Admitting: *Deleted

## 2014-08-08 DIAGNOSIS — Z4802 Encounter for removal of sutures: Secondary | ICD-10-CM | POA: Diagnosis not present

## 2014-08-08 NOTE — ED Notes (Signed)
Terry Jenkins is here for suture removal from his LT hand, placed 07/27/14.

## 2014-08-08 NOTE — ED Provider Notes (Signed)
CSN: [id number]     Arrival date & time 08/08/14  0957 History   First MD Initiated Contact with Patient 08/08/14 1014     Chief Complaint  Patient presents with  . Suture / Staple Removal      HPI Comments: Patient returns for suture removal wound left hand with no complaints.  The history is provided by the patient.    History reviewed. No pertinent past medical history. Past Surgical History  Procedure Laterality Date  . Hernia repair    . Anal fissure repair     Family History  Problem Relation Age of Onset  . Hypertension Mother    History  Substance Use Topics  . Smoking status: Former Research scientist (life sciences)  . Smokeless tobacco: Not on file  . Alcohol Use: Yes    Review of Systems No fever.  No pain in left hand wound.  No drainage. Allergies  Ampicillin  Home Medications   Prior to Admission medications   Medication Sig Start Date End Date Taking? Authorizing Provider  sulfamethoxazole-trimethoprim (SEPTRA DS) 800-160 MG per tablet Take 1 tablet by mouth 2 (two) times daily. X 7 days (antibiotic) 07/27/14   Jacqulyn Cane, MD   There were no vitals taken for this visit. Physical Exam   Appearance:  Patient appears stated age, and in no acute distress Left hand:  Healed laceration without swelling, erythema, tenderness or drainage.  Wound edges friable.     ED Course  Procedures  Sutures removed.  Wound reinforced with Steri-Strips and Dermabond skin adhesive.  MDM   1. Visit for suture removal     Protect healed laceration with bandage for several days.    Kandra Nicolas, MD 08/10/14 [phone]

## 2014-08-08 NOTE — Discharge Instructions (Signed)
Protect healed laceration with bandage for several days.    Scar Minimization You will have a scar anytime you have surgery and a cut is made in the skin or you have something removed from your skin (mole, skin cancer, cyst). Although scars are unavoidable following surgery, there are ways to minimize their appearance. It is important to follow all the instructions you receive from your caregiver about wound care. How your wound heals will influence the appearance of your scar. If you do not follow the wound care instructions as directed, complications such as infection may occur. Wound instructions include keeping the wound clean, moist, and not letting the wound form a scab. Some people form scars that are raised and lumpy (hypertrophic) or larger than the initial wound (keloidal). HOME CARE INSTRUCTIONS   Follow wound care instructions as directed.  Keep the wound clean by washing it with soap and water.  Keep the wound moist with provided antibiotic cream or petroleum jelly until completely healed. Moisten twice a day for about 2 weeks.  Get stitches (sutures) taken out at the scheduled time.  Avoid touching or manipulating your wound unless needed. Wash your hands thoroughly before and after touching your wound.  Follow all restrictions such as limits on exercise or work. This depends on where your scar is located.  Keep the scar protected from sunburn. Cover the scar with sunscreen/sunblock with SPF 30 or higher.  Gently massage the scar using a circular motion to help minimize the appearance of the scar. Do this only after the wound has closed and all the sutures have been removed.  For hypertrophic or keloidal scars, there are several ways to treat and minimize their appearance. Methods include compression therapy, intralesional corticosteroids, laser therapy, or surgery. These methods are performed by your caregiver. Remember that the scar may appear lighter or darker than your  normal skin color. This difference in color should even out with time. SEEK MEDICAL CARE IF:   You have a fever.  You develop signs of infection such as pain, redness, pus, and warmth.  You have questions or concerns. Document Released: 10/08/2009 Document Revised: 07/13/2011 Document Reviewed: 10/08/2009 [facility] Patient Information 2015 [place], [place]. This information is not intended to replace advice given to you by your health care provider. Make sure you discuss any questions you have with your health care provider.

## 2015-02-15 ENCOUNTER — Ambulatory Visit: Payer: Self-pay | Admitting: Sports Medicine

## 2015-03-01 ENCOUNTER — Ambulatory Visit: Payer: Self-pay | Admitting: Sports Medicine

## 2015-06-19 ENCOUNTER — Encounter: Payer: Self-pay | Admitting: *Deleted

## 2015-06-19 ENCOUNTER — Emergency Department
Admission: EM | Admit: 2015-06-19 | Discharge: 2015-06-19 | Disposition: A | Discharge status: Home / Self Care | Payer: Federal, State, Local not specified - PPO | Source: Home / Self Care | Attending: Family Medicine | Admitting: Family Medicine

## 2015-06-19 DIAGNOSIS — R51 Headache: Secondary | ICD-10-CM | POA: Diagnosis not present

## 2015-06-19 DIAGNOSIS — R519 Headache, unspecified: Secondary | ICD-10-CM

## 2015-06-19 DIAGNOSIS — R0981 Nasal congestion: Secondary | ICD-10-CM | POA: Diagnosis not present

## 2015-06-19 MED ORDER — AZITHROMYCIN 250 MG PO TABS: 500.0000 mg | ORAL_TABLET | Freq: Every day | ORAL | Status: DC

## 2015-06-19 NOTE — ED Provider Notes (Signed)
CSN: [id number]     Arrival date & time 06/19/15  1144 History   First MD Initiated Contact with Patient 06/19/15 1145     Chief Complaint  Patient presents with  . Nasal Congestion   (Consider location/radiation/quality/duration/timing/severity/associated sxs/prior Treatment) HPI  The pt is a 45yo male presenting to [facility] with c/o gradually worsening nasal congestion with moderate to severe frontal headache and subjective fever that started 2 days ago.  He has been taking 400mg  ibuprofen every 6 hours for body aches a fever with temporary relief. He has also been using a Netti pot.  Denies cough, ear pain or sore throat. Denies n/v/d. Pt works in the [employer] and is requesting a work note due to low grade fever.   History reviewed. No pertinent past medical history. Past Surgical History  Procedure Laterality Date  . Hernia repair    . Anal fissure repair     Family History  Problem Relation Age of Onset  . Hypertension Mother    Social History  Substance Use Topics  . Smoking status: Former Research scientist (life sciences)  . Smokeless tobacco: None  . Alcohol Use: Yes    Review of Systems  Constitutional: Positive for fever. Negative for chills and appetite change.  HENT: Positive for congestion, rhinorrhea, sinus pressure and sneezing. Negative for ear pain, sore throat, trouble swallowing and voice change.   Respiratory: Negative for cough and shortness of breath.   Cardiovascular: Negative for chest pain and palpitations.  Gastrointestinal: Negative for nausea, vomiting, abdominal pain and diarrhea.  Musculoskeletal: Positive for myalgias and arthralgias. Negative for back pain.  Skin: Negative for rash.  All other systems reviewed and are negative.   Allergies  Ampicillin  Home Medications   Prior to Admission medications   Medication Sig Start Date End Date Taking? Authorizing Provider  ibuprofen (ADVIL,MOTRIN) 200 MG tablet Take 200 mg by mouth every 6 (six) hours as needed.   Yes  Historical Provider, MD  Pseudoephedrine-Ibuprofen (COLD & SINUS RELIEF PO) Take by mouth.   Yes Historical Provider, MD  azithromycin (ZITHROMAX) 250 MG tablet Take 2 tablets (500 mg total) by mouth daily. 2 tabs daily for 3 days 06/19/15   Noland Fordyce, PA-C   Meds Ordered and Administered this Visit  Medications - No data to display  BP 158/84 mmHg  Pulse 81  Temp(Src) 99.1 F (37.3 C) (Oral)  Resp 18  Ht 5\' 9"  (1.753 m)  Wt 161 lb (73.029 kg)  BMI 23.76 kg/m2  SpO2 99% No data found.   Physical Exam  Constitutional: He appears well-developed and well-nourished.  HENT:  Head: Normocephalic and atraumatic.  Right Ear: Tympanic membrane normal.  Left Ear: Tympanic membrane normal.  Nose: Mucosal edema present. Right sinus exhibits frontal sinus tenderness. Right sinus exhibits no maxillary sinus tenderness. Left sinus exhibits frontal sinus tenderness. Left sinus exhibits no maxillary sinus tenderness.  Mouth/Throat: Uvula is midline, oropharynx is clear and moist and mucous membranes are normal.  Eyes: Conjunctivae are normal. No scleral icterus.  Neck: Normal range of motion. Neck supple.  Cardiovascular: Normal rate, regular rhythm and normal heart sounds.   Pulmonary/Chest: Effort normal and breath sounds normal. No respiratory distress. He has no wheezes. He has no rales.  Abdominal: Soft. He exhibits no distension. There is no tenderness.  Musculoskeletal: Normal range of motion.  Neurological: He is alert.  Skin: Skin is warm and dry.  Nursing note and vitals reviewed.   ED Course  Procedures (including critical care time)  Labs Review Labs Reviewed - No data to display  Imaging Review No results found.    MDM   1. Sinus congestion   2. Sinus headache    Pt c/o 2 days of sinus congestion and headache with low grade fever. Temp 99.1*F in Urgent Care today. Encouraged pt to continue symptomatic treatment.  Prescription to hold with expiration date for  Azithromycin provided (pt allergic to Ampicillin). May fill if fever persists, or symptoms not improving in 4-5 days.   Advised pt to use acetaminophen and ibuprofen as needed for fever and pain. Encouraged rest and fluids. F/u with PCP in 7-10 days if not improving, sooner if worsening. Pt verbalized understanding and agreement with tx plan.   Noland Fordyce, PA-C 06/19/15 1254

## 2015-06-19 NOTE — Discharge Instructions (Signed)
You may take 400-600mg Ibuprofen (Motrin) every 6-8 hours for fever and pain  °Alternate with Tylenol  °You may take 500mg Tylenol every 4-6 hours as needed for fever and pain  °Follow-up with your primary care provider next week for recheck of symptoms if not improving.  °Be sure to drink plenty of fluids and rest, at least 8hrs of sleep a night, preferably more while you are sick. °Return urgent care or go to closest ER if you cannot keep down fluids/signs of dehydration, fever not reducing with Tylenol, difficulty breathing/wheezing, stiff neck, worsening condition, or other concerns (see below)  ° °Your symptoms are likely due to a virus such as the common cold, however, if you developing worsening chest congestion with shortness of breath, persistent fever for 3 days, or symptoms not improving in 4-5 days, you may fill the antibiotic (azithromycin).  If you do fill the antibiotic,  please take antibiotics as prescribed and be sure to complete entire course even if you start to feel better to ensure infection does not come back. ° °

## 2015-06-19 NOTE — ED Notes (Signed)
Pt c/o nasal congestion, HA, low grade fever, and sinus pressure x 2 days.

## 2015-06-22 ENCOUNTER — Telehealth: Payer: Self-pay | Admitting: Emergency Medicine

## 2015-09-13 DIAGNOSIS — M9902 Segmental and somatic dysfunction of thoracic region: Secondary | ICD-10-CM | POA: Diagnosis not present

## 2015-09-13 DIAGNOSIS — M9905 Segmental and somatic dysfunction of pelvic region: Secondary | ICD-10-CM | POA: Diagnosis not present

## 2015-09-13 DIAGNOSIS — M9903 Segmental and somatic dysfunction of lumbar region: Secondary | ICD-10-CM | POA: Diagnosis not present

## 2015-09-13 DIAGNOSIS — M9901 Segmental and somatic dysfunction of cervical region: Secondary | ICD-10-CM | POA: Diagnosis not present

## 2015-10-18 DIAGNOSIS — M9902 Segmental and somatic dysfunction of thoracic region: Secondary | ICD-10-CM | POA: Diagnosis not present

## 2015-10-18 DIAGNOSIS — M9903 Segmental and somatic dysfunction of lumbar region: Secondary | ICD-10-CM | POA: Diagnosis not present

## 2015-10-18 DIAGNOSIS — M9901 Segmental and somatic dysfunction of cervical region: Secondary | ICD-10-CM | POA: Diagnosis not present

## 2015-10-18 DIAGNOSIS — M9905 Segmental and somatic dysfunction of pelvic region: Secondary | ICD-10-CM | POA: Diagnosis not present

## 2015-11-15 DIAGNOSIS — M9902 Segmental and somatic dysfunction of thoracic region: Secondary | ICD-10-CM | POA: Diagnosis not present

## 2015-11-15 DIAGNOSIS — M9903 Segmental and somatic dysfunction of lumbar region: Secondary | ICD-10-CM | POA: Diagnosis not present

## 2015-11-15 DIAGNOSIS — M9905 Segmental and somatic dysfunction of pelvic region: Secondary | ICD-10-CM | POA: Diagnosis not present

## 2015-11-15 DIAGNOSIS — M9901 Segmental and somatic dysfunction of cervical region: Secondary | ICD-10-CM | POA: Diagnosis not present

## 2015-12-13 DIAGNOSIS — M9903 Segmental and somatic dysfunction of lumbar region: Secondary | ICD-10-CM | POA: Diagnosis not present

## 2015-12-13 DIAGNOSIS — M9907 Segmental and somatic dysfunction of upper extremity: Secondary | ICD-10-CM | POA: Diagnosis not present

## 2015-12-13 DIAGNOSIS — M9901 Segmental and somatic dysfunction of cervical region: Secondary | ICD-10-CM | POA: Diagnosis not present

## 2015-12-13 DIAGNOSIS — M9905 Segmental and somatic dysfunction of pelvic region: Secondary | ICD-10-CM | POA: Diagnosis not present

## 2016-01-10 DIAGNOSIS — M9905 Segmental and somatic dysfunction of pelvic region: Secondary | ICD-10-CM | POA: Diagnosis not present

## 2016-01-10 DIAGNOSIS — M9903 Segmental and somatic dysfunction of lumbar region: Secondary | ICD-10-CM | POA: Diagnosis not present

## 2016-01-10 DIAGNOSIS — M9901 Segmental and somatic dysfunction of cervical region: Secondary | ICD-10-CM | POA: Diagnosis not present

## 2016-01-10 DIAGNOSIS — M9907 Segmental and somatic dysfunction of upper extremity: Secondary | ICD-10-CM | POA: Diagnosis not present

## 2016-02-11 DIAGNOSIS — M9903 Segmental and somatic dysfunction of lumbar region: Secondary | ICD-10-CM | POA: Diagnosis not present

## 2016-02-11 DIAGNOSIS — M9905 Segmental and somatic dysfunction of pelvic region: Secondary | ICD-10-CM | POA: Diagnosis not present

## 2016-02-11 DIAGNOSIS — M9901 Segmental and somatic dysfunction of cervical region: Secondary | ICD-10-CM | POA: Diagnosis not present

## 2016-02-11 DIAGNOSIS — M9907 Segmental and somatic dysfunction of upper extremity: Secondary | ICD-10-CM | POA: Diagnosis not present

## 2016-03-06 DIAGNOSIS — M9905 Segmental and somatic dysfunction of pelvic region: Secondary | ICD-10-CM | POA: Diagnosis not present

## 2016-03-06 DIAGNOSIS — M9901 Segmental and somatic dysfunction of cervical region: Secondary | ICD-10-CM | POA: Diagnosis not present

## 2016-03-06 DIAGNOSIS — M9903 Segmental and somatic dysfunction of lumbar region: Secondary | ICD-10-CM | POA: Diagnosis not present

## 2016-03-06 DIAGNOSIS — M9907 Segmental and somatic dysfunction of upper extremity: Secondary | ICD-10-CM | POA: Diagnosis not present

## 2016-04-09 DIAGNOSIS — M9905 Segmental and somatic dysfunction of pelvic region: Secondary | ICD-10-CM | POA: Diagnosis not present

## 2016-04-09 DIAGNOSIS — M9903 Segmental and somatic dysfunction of lumbar region: Secondary | ICD-10-CM | POA: Diagnosis not present

## 2016-04-09 DIAGNOSIS — M9907 Segmental and somatic dysfunction of upper extremity: Secondary | ICD-10-CM | POA: Diagnosis not present

## 2016-04-09 DIAGNOSIS — M9901 Segmental and somatic dysfunction of cervical region: Secondary | ICD-10-CM | POA: Diagnosis not present

## 2016-05-07 DIAGNOSIS — M7542 Impingement syndrome of left shoulder: Secondary | ICD-10-CM | POA: Diagnosis not present

## 2016-05-07 DIAGNOSIS — M9901 Segmental and somatic dysfunction of cervical region: Secondary | ICD-10-CM | POA: Diagnosis not present

## 2016-05-07 DIAGNOSIS — M9905 Segmental and somatic dysfunction of pelvic region: Secondary | ICD-10-CM | POA: Diagnosis not present

## 2016-05-07 DIAGNOSIS — M9903 Segmental and somatic dysfunction of lumbar region: Secondary | ICD-10-CM | POA: Diagnosis not present

## 2016-05-18 ENCOUNTER — Telehealth: Payer: Self-pay | Admitting: Sports Medicine

## 2016-05-18 NOTE — Telephone Encounter (Signed)
Patient called advised he left a voicemail but you can disregard that voicemail. Thanks

## 2016-06-09 ENCOUNTER — Encounter: Payer: Self-pay | Admitting: Sports Medicine

## 2016-06-09 ENCOUNTER — Ambulatory Visit (INDEPENDENT_AMBULATORY_CARE_PROVIDER_SITE_OTHER): Payer: Federal, State, Local not specified - PPO

## 2016-06-09 ENCOUNTER — Ambulatory Visit (INDEPENDENT_AMBULATORY_CARE_PROVIDER_SITE_OTHER): Payer: Federal, State, Local not specified - PPO | Admitting: Sports Medicine

## 2016-06-09 DIAGNOSIS — G8929 Other chronic pain: Secondary | ICD-10-CM | POA: Diagnosis not present

## 2016-06-09 DIAGNOSIS — M25512 Pain in left shoulder: Secondary | ICD-10-CM | POA: Diagnosis not present

## 2016-06-09 NOTE — Progress Notes (Signed)
   Subjective:    I'm seeing this patient as a consultation for:  Dr. 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  Past medical history:  Negative.  See flowsheet/record as well for more information.  Surgical history: Negative.  See flowsheet/record as well for more information.  Family history: Negative.  See flowsheet/record as well for more information.  Social history: Negative.  See flowsheet/record as well for more information.  Allergies, and medications have been entered into the medical record, reviewed, and no changes needed.   Review of Systems: No headache, visual changes, nausea, vomiting, diarrhea, constipation, dizziness, abdominal pain, skin rash, fevers, chills, night sweats, weight loss, swollen lymph nodes, body aches, joint swelling, muscle aches, chest pain, shortness of breath, mood changes, visual or auditory hallucinations.   Objective:   General: Well Developed, well nourished, and in no acute distress.  Neuro/Psych: Alert and oriented x3, extra-ocular muscles intact, able to move all 4 extremities, sensation grossly intact. Skin: Warm and dry, no rashes noted.  Respiratory: Not using accessory muscles, speaking in full sentences, trachea midline.  Cardiovascular: Pulses palpable, no extremity edema. Abdomen: Does not appear distended. Left Shoulder: Inspection reveals no abnormalities, atrophy or asymmetry. Palpation is normal with no tenderness over AC joint or bicipital groove. ROM is full in all planes. Rotator cuff strength normal throughout. No signs of impingement with negative Neer and Hawkin's tests, empty can. Speeds and Yergason's tests normal. Positive Obrien's, negative crank, negative clunk, and good stability. Normal  scapular function observed. No painful arc and no drop arm sign. No apprehension sign  Impression and Recommendations:   This case required medical decision making of moderate complexity.  Left shoulder pain Present for 9 months, symptoms and signs are consistent with a labral tear. X-rays, aggressive formal physical therapy. We did discuss avoiding NSAIDs per patient request. If insufficient promote we will proceed with MRI arthrogram.

## 2016-06-09 NOTE — Assessment & Plan Note (Signed)
Present for 9 months, symptoms and signs are consistent with a labral tear. X-rays, aggressive formal physical therapy. We did discuss avoiding NSAIDs per patient request. If insufficient promote we will proceed with MRI arthrogram.

## 2016-06-17 DIAGNOSIS — M9903 Segmental and somatic dysfunction of lumbar region: Secondary | ICD-10-CM | POA: Diagnosis not present

## 2016-06-17 DIAGNOSIS — M7542 Impingement syndrome of left shoulder: Secondary | ICD-10-CM | POA: Diagnosis not present

## 2016-06-17 DIAGNOSIS — M9901 Segmental and somatic dysfunction of cervical region: Secondary | ICD-10-CM | POA: Diagnosis not present

## 2016-06-17 DIAGNOSIS — M9905 Segmental and somatic dysfunction of pelvic region: Secondary | ICD-10-CM | POA: Diagnosis not present

## 2016-06-19 ENCOUNTER — Ambulatory Visit (INDEPENDENT_AMBULATORY_CARE_PROVIDER_SITE_OTHER): Payer: Federal, State, Local not specified - PPO | Admitting: Physical Therapy

## 2016-06-19 ENCOUNTER — Encounter: Payer: Self-pay | Admitting: Physical Therapy

## 2016-06-19 DIAGNOSIS — G8929 Other chronic pain: Secondary | ICD-10-CM | POA: Diagnosis not present

## 2016-06-19 DIAGNOSIS — M25512 Pain in left shoulder: Secondary | ICD-10-CM

## 2016-06-19 DIAGNOSIS — R258 Other abnormal involuntary movements: Secondary | ICD-10-CM

## 2016-06-19 NOTE — Patient Instructions (Addendum)
Trigger Point Dry Needling  . What is Trigger Point Dry Needling (DN)? o DN is a physical therapy technique used to treat muscle pain and dysfunction. Specifically, DN helps deactivate muscle trigger points (muscle knots).  o A thin filiform needle is used to penetrate the skin and stimulate the underlying trigger point. The goal is for a local twitch response (LTR) to occur and for the trigger point to relax. No medication of any kind is injected during the procedure.   . What Does Trigger Point Dry Needling Feel Like?  o The procedure feels different for each individual patient. Some patients report that they do not actually feel the needle enter the skin and overall the process is not painful. Very mild bleeding may occur. However, many patients feel a deep cramping in the muscle in which the needle was inserted. This is the local twitch response.   . How Will I feel after the treatment? o Soreness is normal, and the onset of soreness may not occur for a few hours. Typically this soreness does not last longer than two days.  o Bruising is uncommon, however; ice can be used to decrease any possible bruising.  o In rare cases feeling tired or nauseous after the treatment is normal. In addition, your symptoms may get worse before they get better, this period will typically not last longer than 24 hours.   . What Can I do After My Treatment? o Increase your hydration by drinking more water for the next 24 hours. o You may place ice or heat on the areas treated that have become sore, however, do not use heat on inflamed or bruised areas. Heat often brings more relief post needling. o You can continue your regular activities, but vigorous activity is not recommended initially after the treatment for 24 hours. o DN is best combined with other physical therapy such as strengthening, stretching, and other therapies.      TENS UNIT: This is helpful for muscle pain and spasm.   Search and Purchase  a TENS 7000 2nd edition at www.tenspros.com. It should be less than $30.     TENS unit instructions: Do not shower or bathe with the unit on Turn the unit off before removing electrodes or batteries If the electrodes lose stickiness add a drop of water to the electrodes after they are disconnected from the unit and place on plastic sheet. If you continued to have difficulty, call the TENS unit company to purchase more electrodes. Do not apply lotion on the skin area prior to use. Make sure the skin is clean and dry as this will help prolong the life of the electrodes. After use, always check skin for unusual red areas, rash or other skin difficulties. If there are any skin problems, does not apply electrodes to the same area. Never remove the electrodes from the unit by pulling the wires. Do not use the TENS unit or electrodes other than as directed. Do not change electrode placement without consultating your therapist or physician. Keep 2 fingers with between each electrode. Wear time ratio is 2:1, on to off times.    For example on for 30 minutes off for 15 minutes and then on for 30 minutes off for 15 minutes    

## 2016-06-19 NOTE — Therapy (Signed)
[facility] [place], [place], [address] Phone: [phone]   Fax:  [phone]  Physical Therapy Evaluation  Patient Details  Name: Terry Jenkins MRN: [id number] Date of Birth: 18-Aug-1970 Referring Provider: Dr Dianah Field  Encounter Date: 06/19/2016      PT End of Session - 06/19/16 1010    Visit Number 1   Number of Visits 8   Date for PT Re-Evaluation 07/10/16   PT Start Time [id number]   PT Stop Time 1121   PT Time Calculation (min) 67 min   Activity Tolerance Patient tolerated treatment well      History reviewed. No pertinent past medical history.  Past Surgical History:  Procedure Laterality Date  . ANAL FISSURE REPAIR    . HERNIA REPAIR      There were no vitals filed for this visit.       Subjective Assessment - 06/19/16 1016    Subjective Pt reports he has clunking, popping and snapping in the lt shoulder since july of last year, no pain.  Does alot of lifting in the gym uses free weights. Reports he has some ROM differences on the Lt side.     Pertinent History sees Chiropracter Katheran Awe) for active release therapy. Has h/o broken coller bone Lt side and compression injury on the Lt as a teenager.    Diagnostic tests x-ray (-)    Patient Stated Goals train and perform obstacle runs without the clunking.  Wishes to learn what "it " is.    Currently in Pain? 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   Leisure  work out unconventional      Posture/Postural Control   Posture/Postural Control Postural limitations   Postural Limitations Rounded Shoulders;Forward head  winging scapula, Lt shoulder complex higher than Rt      ROM / Strength   AROM / PROM / Strength AROM;Strength     AROM   AROM Assessment Site Shoulder;Cervical   Right/Left Shoulder Right;Left   Right Shoulder Flexion 172 Degrees   Right Shoulder External Rotation 95 Degrees   Right Shoulder Horizontal ABduction 77 Degrees   Left Shoulder Flexion 167 Degrees   Left Shoulder Internal Rotation 70 Degrees   Left Shoulder External Rotation 92 Degrees   Cervical Flexion WNl   Cervical Extension WNL   Cervical - Right Side Bend WNL   Cervical - Left Side Bend WNL   Cervical - Right Rotation WNL   Cervical - Left Rotation WNL     Strength   Strength Assessment Site Shoulder;Elbow     Palpation   Palpation comment large trigger point in Lt levator                   [facility] Adult PT Treatment/Exercise - 06/19/16 0001      Exercises  Exercises Other Exercises   Other Exercises  Lt shoulder stretches in standing using strap in doorway     Modalities   Modalities Electrical Stimulation;Moist Heat     Moist Heat Therapy   Number Minutes Moist Heat 15 Minutes   Moist Heat Location Cervical;Shoulder  Lt ant shoulder     Electrical Stimulation   Electrical Stimulation Location Lt shoulder complex   Electrical Stimulation Action IFC   Electrical Stimulation Parameters to tolerance   Electrical Stimulation Goals Tone     Manual Therapy   Manual Therapy Soft tissue mobilization   Soft tissue mobilization STW to Lt shoulder musculature and pecs          Trigger Point Dry Needling - 06/19/16 1222    Consent Given? Yes   Education Handout Provided Yes   Muscles Treated Upper Body Pectoralis major;Levator scapulae  teres minor/major all Lt side   Pectoralis Major Response Palpable increased muscle  length;Twitch response elicited   Levator Scapulae Response Palpable increased muscle length;Twitch response elicited              PT Education - 06/19/16 1222    Education provided Yes   Education Details DN, home TENS   Person(s) Educated Patient   Methods Handout   Comprehension Verbalized understanding             PT Long Term Goals - 06/19/16 1233      PT LONG TERM GOAL #1   Title I with advanced HEP(07/10/16)   Time 3   Period Weeks   Status New     PT LONG TERM GOAL #2   Title report clunking/popping reduction =/> 75% with Lt shoulder motion ( 07/10/16)    Time 3   Period Weeks   Status New     PT LONG TERM GOAL #3   Title demo safe form with Lt shoulder strengthening exercises ( 07/10/16)    Time 3   Period Weeks   Status New               Plan - 06/19/16 1224    Clinical Impression Statement 45 yo male presents for low complexity PT eval for Lt shoulder issues.  Pt reports no pain however has feelings of instability and cracking, popping and snapping.  Strength in the Lt shoulder complex is slightly less than the Rt, his mechanics with movement are in the Lt shoulder are inpaired and he has some postural changes.  He has a lot of tightness in the muscles that control the shoulder movement as well.    Rehab Potential Good   PT Frequency 2x / week   PT Duration 3 weeks   PT Treatment/Interventions Moist Heat;Therapeutic exercise;Dry needling;Taping;Vasopneumatic Device;Manual techniques;Neuromuscular re-education;Cryotherapy;Electrical Stimulation;Iontophoresis 4mg /ml Dexamethasone;Patient/family education   PT Next Visit Plan assess response to DN, work on high level stretching and strenghtening shoulder complex   Consulted and Agree with Plan of Care Patient      Patient will benefit from skilled therapeutic intervention in order to improve the following deficits and impairments:  Postural dysfunction, Increased muscle spasms, Decreased  strength  Visit Diagnosis: Chronic left shoulder pain - Plan: PT plan of care cert/re-cert  Other abnormal involuntary movements - Plan: PT plan of care cert/re-cert     Problem List Patient Active Problem List   Diagnosis Date Noted  . Left shoulder pain 06/09/2016  . Ischial bursitis of right side 07/04/2013    Jeral Pinch PT  06/19/2016, 12:36 PM  [facility] [address] [place], [place], [address] Phone: [phone]   Fax:  [phone]  Name: DERRELLE BURDITT MRN: [id number] Date of Birth: 04-Mar-1971

## 2016-06-24 ENCOUNTER — Encounter: Payer: Self-pay | Admitting: Physical Therapy

## 2016-06-24 ENCOUNTER — Ambulatory Visit (INDEPENDENT_AMBULATORY_CARE_PROVIDER_SITE_OTHER): Payer: Federal, State, Local not specified - PPO | Admitting: Physical Therapy

## 2016-06-24 DIAGNOSIS — R259 Unspecified abnormal involuntary movements: Secondary | ICD-10-CM

## 2016-06-24 DIAGNOSIS — R258 Other abnormal involuntary movements: Secondary | ICD-10-CM

## 2016-06-24 DIAGNOSIS — M25512 Pain in left shoulder: Secondary | ICD-10-CM | POA: Diagnosis not present

## 2016-06-24 DIAGNOSIS — G8929 Other chronic pain: Secondary | ICD-10-CM | POA: Diagnosis not present

## 2016-06-24 NOTE — Therapy (Signed)
[place], [place], [address] Phone: [phone]   Fax:  [phone]  Physical Therapy Treatment  Patient Details  Name: Terry Jenkins MRN: [id number] Date of Birth: 04-18-1971 Referring Provider: Dr Dianah Field  Encounter Date: 06/24/2016      PT End of Session - 06/24/16 1014    Visit Number 2   Number of Visits 8   Date for PT Re-Evaluation 07/10/16   PT Start Time [id number]   PT Stop Time 1116   PT Time Calculation (min) 62 min   Activity Tolerance Patient tolerated treatment well      History reviewed. No pertinent past medical history.  Past Surgical History:  Procedure Laterality Date  . ANAL FISSURE REPAIR    . HERNIA REPAIR      There were no vitals filed for this visit.      Subjective Assessment - 06/24/16 1014    Subjective Terry Jenkins reports he feels less tender in the upper shoulder after the needling, he has been doing the stretches I showed him and tried some things he found on the internet, trigger point release and ex for scapular winging.  He is still having the "clunking" in his shoulder.    Currently in Pain? No/denies                         [facility] Adult PT Treatment/Exercise - 06/24/16 0001      Exercises   Exercises Shoulder;Other Exercises     Shoulder Exercises: Supine   Other Supine Exercises serratus pushes with weight      Shoulder Exercises: ROM/Strengthening   UBE (Upper Arm Bike) L3x4' Alt FWD/BWD in standing   Plank --  step ups on 2" step, FWD and lateral, walks with row with wt   Side Plank --  with ER holding wts     Shoulder Exercises: Stretch   Other Shoulder Stretches IR/ER streteches with strap in doorway, pec stretch, posterior shoulder stretch in childs pose.      Modalities   Modalities Electrical Stimulation;Moist Heat     Moist Heat Therapy   Number Minutes Moist Heat 15 Minutes   Moist Heat Location Shoulder  Lt posterior      Electrical Stimulation   Electrical Stimulation Location Lt shoulder complex   Electrical Stimulation Action IFC   Electrical Stimulation Parameters  to toleracne   Electrical Stimulation Goals Tone     Manual Therapy   Manual Therapy Joint mobilization;Soft tissue mobilization   Joint Mobilization Lt scapular mobs   Soft tissue mobilization STW to Lt rhomboids, levator and traps          Trigger Point Dry Needling - 06/24/16 1047    Consent Given? Yes   Education Handout Provided No   Muscles Treated Upper Body Levator scapulae;Rhomboids   Levator Scapulae Response Palpable increased muscle length;Twitch response elicited  Lt   Rhomboids Response Palpable increased muscle length;Twitch response elicited  Lt                   PT Long Term Goals - 06/24/16 1137      PT LONG TERM GOAL #1   Title I with advanced HEP(07/10/16)   Status On-going     PT LONG TERM GOAL #2   Title report clunking/popping reduction =/> 75% with Lt shoulder motion ( 07/10/16)    Status On-going     PT LONG TERM GOAL #3  Title demo safe form with Lt shoulder strengthening exercises ( 07/10/16)    Status On-going               Plan - 06/24/16 1135    Clinical Impression Statement This is Terry Jenkins second visit, he is doing well with his HEP, he pushes himself and likes the challenging exercises.  His Lt scapula is moving better, now he shows decreased control when the scapula comes back to the resting position., he had good control initially then the scapula " drops" quickly .    Rehab Potential Good   PT Frequency 2x / week   PT Duration 3 weeks   PT Treatment/Interventions Moist Heat;Therapeutic exercise;Dry needling;Taping;Vasopneumatic Device;Manual techniques;Neuromuscular re-education;Cryotherapy;Electrical Stimulation;Iontophoresis 4mg /ml Dexamethasone;Patient/family education   PT Next Visit Plan assess response to DN, work on high level stretching and strenghtening shoulder  complex   Consulted and Agree with Plan of Care Patient      Patient will benefit from skilled therapeutic intervention in order to improve the following deficits and impairments:  Postural dysfunction, Increased muscle spasms, Decreased strength  Visit Diagnosis: Chronic left shoulder pain  Other abnormal involuntary movements     Problem List Patient Active Problem List   Diagnosis Date Noted  . Left shoulder pain 06/09/2016  . Ischial bursitis of right side 07/04/2013    Jeral Pinch PT  06/24/2016, 11:42 AM  [facility] [place], [place], [address] Phone: [phone]   Fax:  [phone]  Name: Terry Jenkins MRN: [id number] Date of Birth: 1970-12-05

## 2016-07-01 ENCOUNTER — Ambulatory Visit (INDEPENDENT_AMBULATORY_CARE_PROVIDER_SITE_OTHER): Payer: Federal, State, Local not specified - PPO | Admitting: Physical Therapy

## 2016-07-01 ENCOUNTER — Encounter: Payer: Self-pay | Admitting: Physical Therapy

## 2016-07-01 DIAGNOSIS — R259 Unspecified abnormal involuntary movements: Secondary | ICD-10-CM | POA: Diagnosis not present

## 2016-07-01 DIAGNOSIS — M9903 Segmental and somatic dysfunction of lumbar region: Secondary | ICD-10-CM | POA: Diagnosis not present

## 2016-07-01 DIAGNOSIS — G8929 Other chronic pain: Secondary | ICD-10-CM | POA: Diagnosis not present

## 2016-07-01 DIAGNOSIS — M25512 Pain in left shoulder: Secondary | ICD-10-CM

## 2016-07-01 DIAGNOSIS — M7542 Impingement syndrome of left shoulder: Secondary | ICD-10-CM | POA: Diagnosis not present

## 2016-07-01 DIAGNOSIS — M9905 Segmental and somatic dysfunction of pelvic region: Secondary | ICD-10-CM | POA: Diagnosis not present

## 2016-07-01 DIAGNOSIS — R258 Other abnormal involuntary movements: Secondary | ICD-10-CM | POA: Diagnosis not present

## 2016-07-01 DIAGNOSIS — M9901 Segmental and somatic dysfunction of cervical region: Secondary | ICD-10-CM | POA: Diagnosis not present

## 2016-07-01 NOTE — Therapy (Signed)
[place], [place], [address] Phone: [phone]   Fax:  [phone]  Physical Therapy Treatment  Patient Details  Name: Terry Jenkins MRN: [id number] Date of Birth: [birth year] Referring Provider: Dr Dianah Field  Encounter Date: 07/01/2016      PT End of Session - 07/01/16 1022    Visit Number 3   Number of Visits 8   Date for PT Re-Evaluation 07/10/16   PT Start Time 1022   PT Stop Time (P)  1124   PT Time Calculation (min) (P)  62 min      History reviewed. No pertinent past medical history.  Past Surgical History:  Procedure Laterality Date  . ANAL FISSURE REPAIR    . HERNIA REPAIR      There were no vitals filed for this visit.      Subjective Assessment - 07/01/16 1024    Subjective Terry Jenkins reports he has increased movement in his neck, some less snapping however less than he had. No shoulder pain.    Patient Stated Goals train and perform obstacle runs without the clunking.  Wishes to learn what "it " is.    Currently in Pain? No/denies            [facility] PT Assessment - 07/01/16 0001      Assessment   Medical Diagnosis Chronic Lt shoulder pain    Referring Provider Dr Dianah Field   Onset Date/Surgical Date 11/17/15   Hand Dominance Right                     [facility] Adult PT Treatment/Exercise - 07/01/16 0001      Shoulder Exercises: Prone   Other Prone Exercises plank, hands to/from elbows.      Shoulder Exercises: ROM/Strengthening   UBE (Upper Arm Bike) L3x6' Alt FWD/BWD in standing   Other ROM/Strengthening Exercises fitter, in plank, side to side arms, one black band, one blue.      Shoulder Exercises: Body Blade   Flexion 5 reps   Other Body Blade Exercises Diagonals to fatigue     Manual Therapy   Soft tissue mobilization STW to Lt rhomboids, levator and traps          Trigger Point Dry Needling - 07/01/16 1055    Consent Given? Yes   Education Handout Provided No   Muscles Treated Upper Body Levator scapulae   Levator Scapulae Response Palpable increased muscle length;Twitch response elicited                   PT Long Term Goals - 07/01/16 1025      PT LONG TERM GOAL #1   Title I with advanced HEP(07/10/16)   Status On-going     PT LONG TERM GOAL #2   Title report clunking/popping reduction =/> 75% with Lt shoulder motion ( 07/10/16)    Status On-going  currently 50% reduction     PT LONG TERM GOAL #3   Title demo safe form with Lt shoulder strengthening exercises ( 07/10/16)    Status Achieved               Plan - 07/01/16 1142    Clinical Impression Statement Terry Jenkins contiues to do well, the control of the Lt scapula has improved and he only "drops" with abduction in the last 30% of motion now.  Progressing to goals and should be ready to discharge toHEP at next visit.    Rehab Potential  Good   PT Frequency 2x / week   PT Duration 3 weeks   PT Treatment/Interventions Moist Heat;Therapeutic exercise;Dry needling;Taping;Vasopneumatic Device;Manual techniques;Neuromuscular re-education;Cryotherapy;Electrical Stimulation;Iontophoresis 4mg /ml Dexamethasone;Patient/family education   PT Next Visit Plan assess for discharge   Consulted and Agree with Plan of Care Patient      Patient will benefit from skilled therapeutic intervention in order to improve the following deficits and impairments:  Postural dysfunction, Increased muscle spasms, Decreased strength  Visit Diagnosis: Chronic left shoulder pain  Other abnormal involuntary movements     Problem List Patient Active Problem List   Diagnosis Date Noted  . Left shoulder pain 06/09/2016  . Ischial bursitis of right side 07/04/2013    Terry Jenkins PT 07/01/2016, 11:44 AM  [facility] [place], [place], [address] Phone: [phone]   Fax:  [phone]  Name: Terry Jenkins MRN: [id number] Date of Birth: 04-09-1971

## 2016-07-07 ENCOUNTER — Encounter: Payer: Self-pay | Admitting: Physical Therapy

## 2016-07-07 ENCOUNTER — Ambulatory Visit (INDEPENDENT_AMBULATORY_CARE_PROVIDER_SITE_OTHER): Payer: Federal, State, Local not specified - PPO | Admitting: Physical Therapy

## 2016-07-07 DIAGNOSIS — R258 Other abnormal involuntary movements: Secondary | ICD-10-CM

## 2016-07-07 DIAGNOSIS — R259 Unspecified abnormal involuntary movements: Secondary | ICD-10-CM | POA: Diagnosis not present

## 2016-07-07 DIAGNOSIS — G8929 Other chronic pain: Secondary | ICD-10-CM | POA: Diagnosis not present

## 2016-07-07 DIAGNOSIS — M25512 Pain in left shoulder: Secondary | ICD-10-CM | POA: Diagnosis not present

## 2016-07-07 NOTE — Therapy (Signed)
[place], [place], [address] Phone: [phone]   Fax:  [phone]  Physical Therapy Treatment  Patient Details  Name: Terry Jenkins MRN: [id number] Date of Birth: 20-Aug-1970 Referring Provider: Dr Dianah Field  Encounter Date: 07/07/2016      PT End of Session - 07/07/16 1107    Visit Number 5   Number of Visits 8   Date for PT Re-Evaluation 07/10/16   PT Start Time 1104   PT Stop Time 1207   PT Time Calculation (min) 63 min   Activity Tolerance Patient tolerated treatment well      History reviewed. No pertinent past medical history.  Past Surgical History:  Procedure Laterality Date  . ANAL FISSURE REPAIR    . HERNIA REPAIR      There were no vitals filed for this visit.      Subjective Assessment - 07/07/16 1105    Subjective Terry Jenkins wasn't able to work out as hard as he wanted, he had a muscle spasm in his low back while getting out of his car.    Patient Stated Goals train and perform obstacle runs without the clunking.  Wishes to learn what "it " is.    Currently in Pain? No/denies            [facility] PT Assessment - 07/07/16 0001      Assessment   Medical Diagnosis Chronic Lt shoulder pain    Referring Provider Dr Dianah Field   Onset Date/Surgical Date 11/17/15   Hand Dominance Right     AROM   AROM Assessment Site Shoulder;Cervical  WNL     Strength   Strength Assessment Site Shoulder;Elbow;Thoracic  WNL     Palpation   Palpation comment very small trigger point in Lt levator                     [facility] Adult PT Treatment/Exercise - 07/07/16 0001      Exercises   Exercises Shoulder     Shoulder Exercises: Supine   Other Supine Exercises crab with tricep dip, flip to bear and back     Shoulder Exercises: Prone   Other Prone Exercises plank high/low to side plank with hip dips     Shoulder Exercises: ROM/Strengthening   UBE (Upper Arm Bike) L3x6'  Alt FWD/BWD in standing  on BOSU       Modalities   Modalities Electrical Stimulation;Moist Heat     Moist Heat Therapy   Number Minutes Moist Heat 15 Minutes   Moist Heat Location Shoulder     Electrical Stimulation   Electrical Stimulation Location Lt shoulder complex   Electrical Stimulation Action IFC   Electrical Stimulation Parameters to tolerance   Electrical Stimulation Goals Tone     Manual Therapy   Manual Therapy Soft tissue mobilization   Soft tissue mobilization STW to Lt upper trap, levator          Trigger Point Dry Needling - 07/07/16 1235    Consent Given? Yes   Education Handout Provided No   Muscles Treated Upper Body Levator scapulae  in seated today, was able to get better twitch response   Levator Scapulae Response Palpable increased muscle length;Twitch response elicited              PT Education - 07/07/16 1306    Education provided Yes   Education Details HEP progression   Person(s) Educated Patient   Methods Handout;Demonstration;Explanation  Comprehension Returned demonstration             PT Long Term Goals - 07/07/16 1109      PT LONG TERM GOAL #1   Title I with advanced HEP(07/10/16)   Status Achieved     PT LONG TERM GOAL #2   Title report clunking/popping reduction =/> 75% with Lt shoulder motion ( 07/10/16)    Status Achieved  85% improved     PT LONG TERM GOAL #3   Title demo safe form with Lt shoulder strengthening exercises ( 07/10/16)    Status Achieved               Plan - 07/07/16 1307    Clinical Impression Statement Terry Jenkins has done very well with therapy.  He has met all his goals and is ready for discharge to HEP       Patient will benefit from skilled therapeutic intervention in order to improve the following deficits and impairments:     Visit Diagnosis: Chronic left shoulder pain  Other abnormal involuntary movements     Problem List Patient Active Problem List   Diagnosis Date Noted  .  Left shoulder pain 06/09/2016  . Ischial bursitis of right side 07/04/2013    Jeral Pinch PT 07/07/2016, 1:08 PM  [facility] [place], [place], [address] Phone: [phone]   Fax:  [phone]  Name: Terry Jenkins MRN: [id number] Date of Birth: 02-02-1971   PHYSICAL THERAPY DISCHARGE SUMMARY  Visits from Start of Care: 5  Current functional level related to goals / functional outcomes: All goals met, full ROM and strength   Remaining deficits: none   Education / Equipment: HEP &DN Plan: Patient agrees to discharge.  Patient goals were met. Patient is being discharged due to meeting the stated rehab goals.  ?????    Jeral Pinch, PT 07/07/16 1:11 PM

## 2016-07-08 ENCOUNTER — Ambulatory Visit (INDEPENDENT_AMBULATORY_CARE_PROVIDER_SITE_OTHER): Payer: Federal, State, Local not specified - PPO | Admitting: Sports Medicine

## 2016-07-08 ENCOUNTER — Ambulatory Visit (INDEPENDENT_AMBULATORY_CARE_PROVIDER_SITE_OTHER): Payer: Federal, State, Local not specified - PPO | Admitting: Physician Assistant

## 2016-07-08 ENCOUNTER — Encounter: Payer: Self-pay | Admitting: Sports Medicine

## 2016-07-08 ENCOUNTER — Encounter: Payer: Self-pay | Admitting: Physician Assistant

## 2016-07-08 VITALS — BP 135/75 | HR 66 | Wt 154.0 lb

## 2016-07-08 DIAGNOSIS — Z113 Encounter for screening for infections with a predominantly sexual mode of transmission: Secondary | ICD-10-CM | POA: Diagnosis not present

## 2016-07-08 DIAGNOSIS — E291 Testicular hypofunction: Secondary | ICD-10-CM

## 2016-07-08 DIAGNOSIS — G8929 Other chronic pain: Secondary | ICD-10-CM | POA: Diagnosis not present

## 2016-07-08 DIAGNOSIS — R351 Nocturia: Secondary | ICD-10-CM | POA: Diagnosis not present

## 2016-07-08 DIAGNOSIS — M25512 Pain in left shoulder: Secondary | ICD-10-CM

## 2016-07-08 DIAGNOSIS — Z0000 Encounter for general adult medical examination without abnormal findings: Secondary | ICD-10-CM | POA: Diagnosis not present

## 2016-07-08 LAB — LIPID PANEL W/REFLEX DIRECT LDL
Cholesterol: 243 mg/dL — ABNORMAL HIGH (ref <200)
HDL: 99 mg/dL (ref >40)
LDL-Cholesterol: 129 mg/dL — ABNORMAL HIGH
Non-HDL Cholesterol (Calc): 144 mg/dL — ABNORMAL HIGH (ref <130)
Total CHOL/HDL Ratio: 2.5 Ratio (ref <5.0)
Triglycerides: 55 mg/dL (ref <150)

## 2016-07-08 LAB — TSH: TSH: 2.77 m[IU]/L (ref 0.40–4.50)

## 2016-07-08 LAB — COMPLETE METABOLIC PANEL WITH GFR
ALBUMIN: 4.7 g/dL (ref 3.6–5.1)
ALK PHOS: 55 U/L (ref 40–115)
ALT: 21 U/L (ref 9–46)
AST: 26 U/L (ref 10–40)
BUN: 15 mg/dL (ref 7–25)
CALCIUM: 9.9 mg/dL (ref 8.6–10.3)
CO2: 22 mmol/L (ref 20–31)
CREATININE: 1.14 mg/dL (ref 0.60–1.35)
Chloride: 104 mmol/L (ref 98–110)
GFR, EST AFRICAN AMERICAN: 89 mL/min (ref >=60)
GFR, Est Non African American: 77 mL/min (ref >=60)
Glucose, Bld: 100 mg/dL — ABNORMAL HIGH (ref 65–99)
Potassium: 4.2 mmol/L (ref 3.5–5.3)
Sodium: 143 mmol/L (ref 135–146)
TOTAL PROTEIN: 7.4 g/dL (ref 6.1–8.1)
Total Bilirubin: 0.7 mg/dL (ref 0.2–1.2)

## 2016-07-08 LAB — CBC
HEMATOCRIT: 46.5 % (ref 38.5–50.0)
Hemoglobin: 15.2 g/dL (ref 13.2–17.1)
MCH: 29.3 pg (ref 27.0–33.0)
MCHC: 32.7 g/dL (ref 32.0–36.0)
MCV: 89.6 fL (ref 80.0–100.0)
MPV: 9.5 fL (ref 7.5–12.5)
Platelets: 276 10*3/uL (ref 140–400)
RBC: 5.19 MIL/uL (ref 4.20–5.80)
RDW: 13.3 % (ref 11.0–15.0)
WBC: 6.6 10*3/uL (ref 3.8–10.8)

## 2016-07-08 LAB — T4, FREE: FREE T4: 1.4 ng/dL (ref 0.8–1.8)

## 2016-07-08 LAB — PSA: PSA: 2.6 ng/mL (ref <=4.0)

## 2016-07-08 LAB — HEMOGLOBIN A1C
HEMOGLOBIN A1C: 5 % (ref <5.7)
MEAN PLASMA GLUCOSE: 97 mg/dL

## 2016-07-08 NOTE — Patient Instructions (Addendum)
For nighttime urination: - No caffeine after 2pm - No liquids 3 hours before bed  For diarrhea - daily probiotic   Physical Activity Recommendations for modifying lipids and lowering blood pressure Engage in aerobic physical activity to reduce LDL-cholesterol, non-HDL-cholesterol, and blood pressure  Frequency: 3-4 sessions per week  Intensity: moderate to vigorous  Duration: 40 minutes on average  Physical Activity Recommendations for secondary prevention 1. Aerobic exercise  Frequency: 3-5 sessions per week  Intensity: 50-80% capacity  Duration: 20 - 60 minutes  Examples: walking, treadmill, cycling, rowing, stair climbing, and arm/leg ergometry  2. Resistance exercise  Frequency: 2-3 sessions per week  Intensity: 10-15 repetitions/set to moderate fatigue  Duration: 1-3 sets of 8-10 upper and lower body exercises  Examples: calisthenics, elastic bands, cuff/hand weights, dumbbels, free weights, wall pulleys, and weight machines  Heart-Healthy Lifestyle  Eating a diet rich in vegetables, fruits and whole grains: also includes low-fat dairy products, poultry, fish, legumes, and nuts; limit intake of sweets, sugar-sweetened beverages and red meats  Getting regular exercise  Maintaining a healthy weight  Not smoking or getting help quitting  Staying on top of your health; for some people, lifestyle changes alone may not be enough to prevent a heart attack or stroke. In these cases, taking a statin at the right dose will most likely be necessary   Preventive Care 18-39 Years, Male Preventive care refers to lifestyle choices and visits with your health care provider that can promote health and wellness. What does preventive care include?  A yearly physical exam. This is also called an annual well check.  Dental exams once or twice a year.  Routine eye exams. Ask your health care provider how often you should have your eyes checked.  Personal lifestyle choices,  including:  Daily care of your teeth and gums.  Regular physical activity.  Eating a healthy diet.  Avoiding tobacco and drug use.  Limiting alcohol use.  Practicing safe sex. What happens during an annual well check? The services and screenings done by your health care provider during your annual well check will depend on your age, overall health, lifestyle risk factors, and family history of disease. Counseling  Your health care provider may ask you questions about your:  Alcohol use.  Tobacco use.  Drug use.  Emotional well-being.  Home and relationship well-being.  Sexual activity.  Eating habits.  Work and work Statistician. Screening  You may have the following tests or measurements:  Height, weight, and BMI.  Blood pressure.  Lipid and cholesterol levels. These may be checked every 5 years starting at age 18.  Diabetes screening. This is done by checking your blood sugar (glucose) after you have not eaten for a while (fasting).  Skin check.  Hepatitis C blood test.  Hepatitis B blood test.  Sexually transmitted disease (STD) testing. Discuss your test results, treatment options, and if necessary, the need for more tests with your health care provider. Vaccines  Your health care provider may recommend certain vaccines, such as:  Influenza vaccine. This is recommended every year.  Tetanus, diphtheria, and acellular pertussis (Tdap, Td) vaccine. You may need a Td booster every 10 years.  Varicella vaccine. You may need this if you have not been vaccinated.  HPV vaccine. If you are 39 or younger, you may need three doses over 6 months.  Measles, mumps, and rubella (MMR) vaccine. You may need at least one dose of MMR.You may also need a second dose.  Pneumococcal 13-valent conjugate (  PCV13) vaccine. You may need this if you have certain conditions and have not been vaccinated.  Pneumococcal polysaccharide (PPSV23) vaccine. You may need one or two  doses if you smoke cigarettes or if you have certain conditions.  Meningococcal vaccine. One dose is recommended if you are age 51-21 years and a first-year college student living in a residence hall, or if you have one of several medical conditions. You may also need additional booster doses.  Hepatitis A vaccine. You may need this if you have certain conditions or if you travel or work in places where you may be exposed to hepatitis A.  Hepatitis B vaccine. You may need this if you have certain conditions or if you travel or work in places where you may be exposed to hepatitis B.  Haemophilus influenzae type b (Hib) vaccine. You may need this if you have certain risk factors. Talk to your health care provider about which screenings and vaccines you need and how often you need them. This information is not intended to replace advice given to you by your health care provider. Make sure you discuss any questions you have with your health care provider. Document Released: 06/16/2001 Document Revised: 01/08/2016 Document Reviewed: 02/19/2015 Elsevier Interactive Patient Education  2017 [employer].

## 2016-07-08 NOTE — Assessment & Plan Note (Signed)
Scapular dyskinesis per physical therapy, also some symptoms of multi directional instability and labral injury. Luckily he has no pain now after physical therapy, and mechanical symptoms are few and far between. Continue rehabilitation exercises at home, no further intervention needed.

## 2016-07-08 NOTE — Progress Notes (Signed)
HPI:                                                                Terry Jenkins is a 46 y.o. male who presents to [facility]: [facility] today for annual physical exam  Current Concerns include would like to check PSA and nocturia  Patient reports nocturia, more than twice per night on most nights for the past year. Denies incomplete bladder emptying, post-void dribbling, or obstructive symptoms. Denies urinary frequency or urgency during the day. Denies urethral discharge. Patient states he was seen by urology in the past and had a normal PSA and DRE. Family history significant for prostate cancer in his paternal uncle.  Patient also endorses decreased libido, decreased strength/endurance, loss in height, decrease enjoyment of life, feeling irritable/sad, less strong erections, and difficulty maintaining erections  Health Maintenance Health Maintenance  Topic Date Due  . HIV Screening  05/27/1986  . INFLUENZA VACCINE  12/03/2015  . TETANUS/TDAP  08/12/2022     Health Habits  Diet: paleo "somewhat"  Exercise: HIT, strength training  ETOH: 1-2 beers nightly  Tobacco: no  Drugs: no, former marijuana  Past Medical History:  Diagnosis Date  . Anxiety   . Hypercholesteremia   . Marijuana smoker in remission [facility])    Past Surgical History:  Procedure Laterality Date  . ANAL FISSURE REPAIR    . HERNIA REPAIR     Social History  Substance Use Topics  . Smoking status: Former Smoker    Packs/day: 1.00    Years: 15.00    Types: Cigarettes    Quit date: 03/04/2004  . Smokeless tobacco: Former Systems developer    Types: Snuff  . Alcohol use 4.2 oz/week    7 Cans of beer per week   family history includes Brain cancer in his father; Colon cancer in his maternal grandmother; Hyperlipidemia in his mother; Hypertension in his mother; Prostate cancer in his paternal uncle.  ROS: negative except as noted in the HPI  Medications: No current  outpatient prescriptions on file.   No current facility-administered medications for this visit.    Allergies  Allergen Reactions  . Ampicillin Rash     Objective:  BP 135/75   Pulse 66   Wt 154 lb (69.9 kg)   BMI 22.74 kg/m  Gen: well-groomed, cooperative, not ill-appearing, no distress HEENT: normal conjunctiva, wearing contact lenses, TM's clear, oropharynx clear, moist mucus membranes, no thyromegaly or tenderness Pulm: Normal work of breathing, clear to auscultation bilaterally CV: Normal rate, regular rhythm, s1 and s2 distinct, no murmurs, clicks or rubs appreciated on this exam, no carotid bruit GI: soft, nondistended, nontender, no masses Neuro: alert and oriented x 3, EOM's intact, PERRLA, DTR's intact MSK: strength 5/5 and symmetric, normal gait, distal pulses intact, no peripheral edema Skin: warm and dry, no rashes or lesions on exposed skin Psych: anxious affect, pleasant mood, normal speech and thought content  Depression screen [facility] 2/9 07/08/2016  Decreased Interest 0  Down, Depressed, Hopeless 0  PHQ - 2 Score 0     Assessment and Plan: 46 y.o. male with   Annual physical exam - CBC - COMPLETE METABOLIC PANEL WITH GFR - Hemoglobin A1c - Lipid Panel w/reflex Direct LDL - PSA  Routine screening for STI (sexually transmitted infection) - Hepatitis C antibody - HIV antibody - GC Probe amplification, urine - RPR  Nocturia - no caffeine after 2pm, no fluids within 3 hours of bedtime - if no improvement with   Hypogonadism in male - Testosterone Total,Free,Bio, Males - patient also requested DHEA-sulfate, Estradiol, and Dihydrotestosterone  - TSH, T4, free  Patient education and anticipatory guidance given Patient agrees with treatment plan Follow-up in 1 year  or sooner as needed  Darlyne Russian PA-C

## 2016-07-08 NOTE — Progress Notes (Signed)
  Subjective:    CC: Follow-up  HPI: Left shoulder pain: Continues to improve significantly with physical therapy, he essentially has no pain now, and a few and far between pop or catch. Physical therapist suspects more scapular dyskinesis rather than labral injury, otherwise he is doing better, and agrees to continue his rehabilitation exercises at home.  Past medical history:  Negative.  See flowsheet/record as well for more information.  Surgical history: Negative.  See flowsheet/record as well for more information.  Family history: Negative.  See flowsheet/record as well for more information.  Social history: Negative.  See flowsheet/record as well for more information.  Allergies, and medications have been entered into the medical record, reviewed, and no changes needed.   Review of Systems: No fevers, chills, night sweats, weight loss, chest pain, or shortness of breath.   Objective:    General: Well Developed, well nourished, and in no acute distress.  Neuro: Alert and oriented x3, extra-ocular muscles intact, sensation grossly intact.  HEENT: Normocephalic, atraumatic, pupils equal round reactive to light, neck supple, no masses, no lymphadenopathy, thyroid nonpalpable.  Skin: Warm and dry, no rashes. Cardiac: Regular rate and rhythm, no murmurs rubs or gallops, no lower extremity edema.  Respiratory: Clear to auscultation bilaterally. Not using accessory muscles, speaking in full sentences. Left Shoulder: Inspection reveals no abnormalities, atrophy or asymmetry. Palpation is normal with no tenderness over AC joint or bicipital groove. ROM is full in all planes. Rotator cuff strength normal throughout. No signs of impingement with negative Neer and Hawkin's tests, empty can. Speeds and Yergason's tests normal. No labral pathology noted with negative Obrien's, negative crank, negative clunk, and good stability. Normal scapular function observed. No painful arc and no drop arm  sign. No apprehension sign  Impression and Recommendations:    Left shoulder pain Scapular dyskinesis per physical therapy, also some symptoms of multi directional instability and labral injury. Luckily he has no pain now after physical therapy, and mechanical symptoms are few and far between. Continue rehabilitation exercises at home, no further intervention needed.  I spent 25 minutes with this patient, greater than 50% was face-to-face time counseling regarding the above diagnoses

## 2016-07-09 LAB — GC/CHLAMYDIA PROBE AMP
CT PROBE, AMP APTIMA: NOT DETECTED
GC Probe RNA: NOT DETECTED

## 2016-07-09 LAB — ESTRADIOL: Estradiol: 24 pg/mL (ref <=39)

## 2016-07-09 LAB — TESTOSTERONE TOTAL,FREE,BIO, MALES
Albumin: 4.7 g/dL (ref 3.6–5.1)
Sex Hormone Binding: 34 nmol/L (ref 10–50)
TESTOSTERONE FREE: 36.6 pg/mL — AB (ref 46.0–224.0)
Testosterone, Bioavailable: 78.4 ng/dL — ABNORMAL LOW (ref 110.0–575.0)
Testosterone: 299 ng/dL (ref 250–827)

## 2016-07-09 LAB — NEISSERIA GONORRHOEAE, PROBE AMP: GC PROBE AMP APTIMA: NOT DETECTED

## 2016-07-09 LAB — RPR

## 2016-07-09 LAB — HIV ANTIBODY (ROUTINE TESTING W REFLEX): HIV 1&2 Ab, 4th Generation: NONREACTIVE

## 2016-07-09 LAB — HEPATITIS C ANTIBODY: HCV AB: NEGATIVE

## 2016-07-09 LAB — DHEA-SULFATE: DHEA SO4: 315 ug/dL (ref 70–495)

## 2016-07-11 LAB — DIHYDROTESTOSTERONE: Dihydrotestosterone LC/MS/MS: 36 ng/dL (ref 16–79)

## 2016-07-15 NOTE — Addendum Note (Signed)
Addended by: Nelson Chimes E on: 07/15/2016 05:33 PM   Modules accepted: Orders

## 2016-07-15 NOTE — Progress Notes (Signed)
Patient found to have low bioavailable testosterone and total testosterone on the low end of normal on labs. Rechecking 8am testosterone.  Lab Results  Component Value Date   TESTOSTERONE 299 07/08/2016

## 2016-07-16 ENCOUNTER — Other Ambulatory Visit: Payer: Self-pay | Admitting: Physician Assistant

## 2016-07-16 ENCOUNTER — Other Ambulatory Visit: Payer: Self-pay

## 2016-07-16 DIAGNOSIS — E2740 Unspecified adrenocortical insufficiency: Secondary | ICD-10-CM

## 2016-07-16 DIAGNOSIS — E291 Testicular hypofunction: Secondary | ICD-10-CM

## 2016-07-17 LAB — CORTISOL: CORTISOL PLASMA: 21.3 ug/dL

## 2016-07-20 LAB — TESTOSTERONE TOTAL,FREE,BIO, MALES
Albumin: 4.4 g/dL (ref 3.6–5.1)
SEX HORMONE BINDING: 29 nmol/L (ref 10–50)
TESTOSTERONE FREE: 39.8 pg/mL — AB (ref 46.0–224.0)
TESTOSTERONE: 283 ng/dL (ref 250–827)
Testosterone, Bioavailable: 80.1 ng/dL — ABNORMAL LOW (ref 110.0–575.0)

## 2016-07-21 ENCOUNTER — Telehealth: Payer: Self-pay

## 2016-07-21 ENCOUNTER — Ambulatory Visit (INDEPENDENT_AMBULATORY_CARE_PROVIDER_SITE_OTHER): Payer: Federal, State, Local not specified - PPO | Admitting: Physician Assistant

## 2016-07-21 VITALS — BP 135/78 | HR 60 | Wt 154.0 lb

## 2016-07-21 DIAGNOSIS — I861 Scrotal varices: Secondary | ICD-10-CM | POA: Diagnosis not present

## 2016-07-21 DIAGNOSIS — E291 Testicular hypofunction: Secondary | ICD-10-CM | POA: Diagnosis not present

## 2016-07-21 NOTE — Telephone Encounter (Signed)
Opened in error. -EH/RMA  

## 2016-07-22 ENCOUNTER — Encounter: Payer: Self-pay | Admitting: Physician Assistant

## 2016-07-22 DIAGNOSIS — I861 Scrotal varices: Secondary | ICD-10-CM | POA: Insufficient documentation

## 2016-07-22 LAB — FOLLICLE STIMULATING HORMONE: FSH: 6.6 m[IU]/mL (ref 1.6–8.0)

## 2016-07-22 LAB — PROLACTIN: PROLACTIN: 5.6 ng/mL (ref 2.0–18.0)

## 2016-07-22 LAB — VITAMIN D 25 HYDROXY (VIT D DEFICIENCY, FRACTURES): VIT D 25 HYDROXY: 35 ng/mL (ref 30–100)

## 2016-07-22 LAB — LUTEINIZING HORMONE: LH: 3 m[IU]/mL (ref 1.5–9.3)

## 2016-07-22 NOTE — Progress Notes (Signed)
HPI:                                                                Terry Jenkins is a 46 y.o. male who presents to [facility] today for follow-up hypogonadism  Patient presents today to review his lab results and discuss hypogonadism. His total testosterone has been on the low end of normal on 2 separate occasions, and his bioavailable testosterone is low. Patient also endorses low libido, decreased muscle mass/strength, erectile dysfunction, and testicular atrophy. Patient has a history of marijuana abuse. He no longer drinks alcohol or smokes marijuana.  Patient states he would not like TRT at this time even though he is symptomatic. He has researched the risks and feels they outweigh the benefits. He is interested in using an OTC supplement called Super Miraforte, which is advertised as a "natural testosterone booster." Lab Results  Component Value Date   TESTOSTERONE 283 07/16/2016   He would like to have vitamin D level checked as well.  Past Medical History:  Diagnosis Date  . Anxiety   . Hypercholesteremia   . Marijuana smoker in remission ([place])   . Varicocele    Past Surgical History:  Procedure Laterality Date  . ANAL FISSURE REPAIR    . HERNIA REPAIR     Social History  Substance Use Topics  . Smoking status: Former Smoker    Packs/day: 1.00    Years: 15.00    Types: Cigarettes    Quit date: 03/04/2004  . Smokeless tobacco: Former Systems developer    Types: Snuff  . Alcohol use No   family history includes Brain cancer in his father; Colon cancer in his maternal grandmother; Hyperlipidemia in his mother; Hypertension in his mother; Prostate cancer in his paternal uncle.  ROS: negative except as noted in the HPI  Medications: No current outpatient prescriptions on file.   No current facility-administered medications for this visit.    Allergies  Allergen Reactions  . Ampicillin Rash       Objective:  BP 135/78    Pulse 60   Wt 154 lb (69.9 kg)   BMI 22.74 kg/m  Gen: well-groomed, cooperative, not ill-appearing, no distress Pulm: Normal work of breathing, normal phonation GU: deferred Neuro: alert and oriented x 3, EOM's intact, no tremor MSK: moving all extremities, normal gait and station Psych: good eye contact, appropriate affect, euthymic mood, normal speech and vocabulary, organized thought content   Assessment and Plan: 46 y.o. male with   1. Male hypogonadism - we reviewed all of patient's labs in detail, including CBC, CMP, TSH, free T4, Hgb A1c, PSA, and testosterone, and STI screening - Discussed risks and benefits of TRT. Declines TRT at this time - I am not familiar with Super Miraforte supplement, but told him I would research ingredients for safety and get back to him - Luteinizing hormone - FSH - Prolactin - VITAMIN D 25 Hydroxy (Vit-D Deficiency, Fractures)  2. Varicocele - Ambulatory referral to Urology  Patient education and anticipatory guidance given Patient agrees with treatment plan Follow-up as needed if symptoms worsen or fail to improve  Darlyne Russian PA-C

## 2016-07-29 DIAGNOSIS — M9905 Segmental and somatic dysfunction of pelvic region: Secondary | ICD-10-CM | POA: Diagnosis not present

## 2016-07-29 DIAGNOSIS — M7632 Iliotibial band syndrome, left leg: Secondary | ICD-10-CM | POA: Diagnosis not present

## 2016-07-29 DIAGNOSIS — M9903 Segmental and somatic dysfunction of lumbar region: Secondary | ICD-10-CM | POA: Diagnosis not present

## 2016-07-29 DIAGNOSIS — M9901 Segmental and somatic dysfunction of cervical region: Secondary | ICD-10-CM | POA: Diagnosis not present

## 2016-08-18 ENCOUNTER — Telehealth: Payer: Self-pay

## 2016-08-18 NOTE — Telephone Encounter (Signed)
Pt states that during his last visit you all discussed him going to urology and a fertility specialist.  He has an appointment with urology in May but I didn't see a referral for the fertility specialist. He would like to see go to the fertility visit before going to urology.  Please advise. -EH/RMA

## 2016-08-18 NOTE — Telephone Encounter (Signed)
Can patient clarify why he would like to see a fertility specialist? If he is not actively trying to achieve a pregnancy or having fertility issues, insurance may not pay for the visit. I can still place the referral for him, but he needs to be aware that he may have to pay out of pocket for any testing/consultation fees.

## 2016-08-19 NOTE — Telephone Encounter (Signed)
Pt wasn't sure why he needed to go to a fertility clinic.  He stated that he'll just go through with the urology appointment that he has in May. -EH/RMA

## 2016-08-20 DIAGNOSIS — M9901 Segmental and somatic dysfunction of cervical region: Secondary | ICD-10-CM | POA: Diagnosis not present

## 2016-08-20 DIAGNOSIS — M7632 Iliotibial band syndrome, left leg: Secondary | ICD-10-CM | POA: Diagnosis not present

## 2016-08-20 DIAGNOSIS — M9905 Segmental and somatic dysfunction of pelvic region: Secondary | ICD-10-CM | POA: Diagnosis not present

## 2016-08-20 DIAGNOSIS — M9903 Segmental and somatic dysfunction of lumbar region: Secondary | ICD-10-CM | POA: Diagnosis not present

## 2016-09-03 ENCOUNTER — Emergency Department (HOSPITAL_BASED_OUTPATIENT_CLINIC_OR_DEPARTMENT_OTHER): Payer: Federal, State, Local not specified - PPO

## 2016-09-03 ENCOUNTER — Encounter (HOSPITAL_BASED_OUTPATIENT_CLINIC_OR_DEPARTMENT_OTHER): Payer: Self-pay | Admitting: *Deleted

## 2016-09-03 ENCOUNTER — Emergency Department (HOSPITAL_BASED_OUTPATIENT_CLINIC_OR_DEPARTMENT_OTHER)
Admission: EM | Admit: 2016-09-03 | Discharge: 2016-09-03 | Disposition: A | Discharge status: Home / Self Care | Payer: Federal, State, Local not specified - PPO | Attending: Emergency Medicine | Admitting: Emergency Medicine

## 2016-09-03 DIAGNOSIS — Y929 Unspecified place or not applicable: Secondary | ICD-10-CM | POA: Diagnosis not present

## 2016-09-03 DIAGNOSIS — W260XXA Contact with knife, initial encounter: Secondary | ICD-10-CM | POA: Diagnosis not present

## 2016-09-03 DIAGNOSIS — S61211A Laceration without foreign body of left index finger without damage to nail, initial encounter: Secondary | ICD-10-CM | POA: Diagnosis not present

## 2016-09-03 DIAGNOSIS — Y999 Unspecified external cause status: Secondary | ICD-10-CM | POA: Diagnosis not present

## 2016-09-03 DIAGNOSIS — S61311A Laceration without foreign body of left index finger with damage to nail, initial encounter: Secondary | ICD-10-CM | POA: Insufficient documentation

## 2016-09-03 DIAGNOSIS — Y9389 Activity, other specified: Secondary | ICD-10-CM | POA: Insufficient documentation

## 2016-09-03 DIAGNOSIS — Z87891 Personal history of nicotine dependence: Secondary | ICD-10-CM | POA: Insufficient documentation

## 2016-09-03 NOTE — ED Notes (Signed)
Per x ray tech  Pt refused hand x ray

## 2016-09-03 NOTE — ED Triage Notes (Signed)
Avulsion to the tip of his left index finger. Happened while doing food prep with a sharp knife. Bleeding controlled.

## 2016-09-03 NOTE — ED Provider Notes (Signed)
[facility] Provider Note   CSN: [id number] Arrival date & time: 09/03/16  2036   By signing my name below, I, Neta Mends, attest that this documentation has been prepared under the direction and in the presence of Quincy Carnes, PA-C. Electronically Signed: Neta Mends, ED Scribe. 09/03/2016. 10:55 PM.   History   Chief Complaint Chief Complaint  Patient presents with  . Laceration    The history is provided by the patient. No language interpreter was used.   HPI Comments:  CONO GEBHARD is a 46 y.o. male who presents to the Emergency Department complaining of a wound sustained to the left index finger that occurred PTA. Pt reports that he was cutting food and he cut off a piece of skin on the tip of the finger with the a kitchen knife. Tetanus UTD. Bleeding is controlled with pressure dressing. Pt denies other associated symptoms.  Patient is right hand dominant.  Past Medical History:  Diagnosis Date  . Anxiety   . Hypercholesteremia   . Marijuana smoker in remission ([place])   . Varicocele     Patient Active Problem List   Diagnosis Date Noted  . Varicocele 07/22/2016  . Nocturia 07/08/2016  . Hypogonadism in male 07/08/2016  . Left shoulder pain 06/09/2016  . Ischial bursitis of right side 07/04/2013    Past Surgical History:  Procedure Laterality Date  . ANAL FISSURE REPAIR    . HERNIA REPAIR         Home Medications    Prior to Admission medications   Not on File    Family History Family History  Problem Relation Age of Onset  . Brain cancer Father   . Hypertension Mother   . Hyperlipidemia Mother   . Prostate cancer Paternal Uncle   . Colon cancer Maternal Grandmother     Social History Social History  Substance Use Topics  . Smoking status: Former Smoker    Packs/day: 1.00    Years: 15.00    Types: Cigarettes    Quit date: 03/04/2004  . Smokeless tobacco: Former Systems developer    Types: Snuff  . Alcohol use No      Allergies   Ampicillin   Review of Systems Review of Systems  Skin: Positive for wound.  Neurological: Negative for numbness.  All other systems reviewed and are negative.    Physical Exam Updated Vital Signs BP 127/85   Pulse 72   Temp 98.6 F (37 C) (Oral)   Resp 16   Ht 5\' 9"  (1.753 m)   Wt 155 lb (70.3 kg)   SpO2 96%   BMI 22.89 kg/m   Physical Exam  Constitutional: He is oriented to person, place, and time. He appears well-developed and well-nourished.  HENT:  Head: Normocephalic and atraumatic.  Mouth/Throat: Oropharynx is clear and moist.  Eyes: Conjunctivae and EOM are normal. Pupils are equal, round, and reactive to light.  Neck: Normal range of motion.  Cardiovascular: Normal rate, regular rhythm and normal heart sounds.   Pulmonary/Chest: Effort normal and breath sounds normal.  Abdominal: Soft. Bowel sounds are normal.  Musculoskeletal: Normal range of motion.  Left index finger with avulsion type laceration along medial, distal aspect of the finger, corner of the distal nail has been avulsed as well, remainder of the nail is intact and remains adhered to nail bed; full flexion/extension of finger maintained, no bony deformities noted; normal cap refill, normal sensation  Neurological: He is alert and oriented to person,  place, and time.  Skin: Skin is warm and dry.  Psychiatric: He has a normal mood and affect.  Nursing note and vitals reviewed.    ED Treatments / Results  DIAGNOSTIC STUDIES:  Oxygen Saturation is 100% on RA, normal by my interpretation.    COORDINATION OF CARE:  10:54 PM  Discussed treatment plan with pt at bedside and pt agreed to plan.   Labs (all labs ordered are listed, but only abnormal results are displayed) Labs Reviewed - No data to display  EKG  EKG Interpretation None       Radiology No results found.  Procedures Procedures (including critical care time)  Medications Ordered in ED Medications - No  data to display   Initial Impression / Assessment and Plan / ED Course  I have reviewed the triage vital signs and the nursing notes.  Pertinent labs & imaging results that were available during my care of the patient were reviewed by me and considered in my medical decision making (see chart for details).  46 year old male here with left index finger laceration from a kitchen knife. On exam he has an avulsion type laceration of the medial aspect of the left index finger. Corner of his nail has been avulsed away as well, remainder of nail is intact and strongly adhered to the nailbed.  Bleeding is controlled with dressing. No bony deformity or exposed tendon.  Hand remains neurovascularly intact. Unfortunately, given nature of injury cannot be formally repaired here. His tetanus is up-to-date.  Pressure dressing with quick clot applied.  Discussed home wound care.  Given hand surgery follow-up for any acute changes.  Otherwise can follow-up with PCP.  Discussed plan with patient, he acknowledged understanding and agreed with plan of care.  Return precautions given for new or worsening symptoms.  Final Clinical Impressions(s) / ED Diagnoses   Final diagnoses:  Laceration of left index finger without foreign body with damage to nail, initial encounter    New Prescriptions New Prescriptions   No medications on file   I personally performed the services described in this documentation, which was scribed in my presence. The recorded information has been reviewed and is accurate.    Larene Pickett, PA-C 09/03/16 [place], MD 09/04/16 [phone]

## 2016-09-03 NOTE — ED Notes (Signed)
Pt verbalizes understanding of d/c instructions and denies any further need at this time. 

## 2016-09-03 NOTE — Discharge Instructions (Signed)
Keep finger bandaged for the next few hours. Can change when needed. It will probably take a few weeks for the whole nail to heal completely. You can call Dr. Fredna Dow who is our hand specialist if you have any issues. Return to the ED for new or worsening symptoms.

## 2016-09-10 DIAGNOSIS — M9901 Segmental and somatic dysfunction of cervical region: Secondary | ICD-10-CM | POA: Diagnosis not present

## 2016-09-10 DIAGNOSIS — M9903 Segmental and somatic dysfunction of lumbar region: Secondary | ICD-10-CM | POA: Diagnosis not present

## 2016-09-10 DIAGNOSIS — M9905 Segmental and somatic dysfunction of pelvic region: Secondary | ICD-10-CM | POA: Diagnosis not present

## 2016-09-10 DIAGNOSIS — M7632 Iliotibial band syndrome, left leg: Secondary | ICD-10-CM | POA: Diagnosis not present

## 2016-09-24 DIAGNOSIS — E291 Testicular hypofunction: Secondary | ICD-10-CM | POA: Diagnosis not present

## 2016-09-24 DIAGNOSIS — I861 Scrotal varices: Secondary | ICD-10-CM | POA: Diagnosis not present

## 2016-10-14 DIAGNOSIS — M9903 Segmental and somatic dysfunction of lumbar region: Secondary | ICD-10-CM | POA: Diagnosis not present

## 2016-10-14 DIAGNOSIS — M9905 Segmental and somatic dysfunction of pelvic region: Secondary | ICD-10-CM | POA: Diagnosis not present

## 2016-10-14 DIAGNOSIS — M9901 Segmental and somatic dysfunction of cervical region: Secondary | ICD-10-CM | POA: Diagnosis not present

## 2016-10-14 DIAGNOSIS — M7632 Iliotibial band syndrome, left leg: Secondary | ICD-10-CM | POA: Diagnosis not present

## 2016-11-12 DIAGNOSIS — M9901 Segmental and somatic dysfunction of cervical region: Secondary | ICD-10-CM | POA: Diagnosis not present

## 2016-11-12 DIAGNOSIS — M7632 Iliotibial band syndrome, left leg: Secondary | ICD-10-CM | POA: Diagnosis not present

## 2016-11-12 DIAGNOSIS — M9905 Segmental and somatic dysfunction of pelvic region: Secondary | ICD-10-CM | POA: Diagnosis not present

## 2016-11-12 DIAGNOSIS — M9903 Segmental and somatic dysfunction of lumbar region: Secondary | ICD-10-CM | POA: Diagnosis not present

## 2016-12-04 DIAGNOSIS — K080 Exfoliation of teeth due to systemic causes: Secondary | ICD-10-CM | POA: Diagnosis not present

## 2016-12-17 DIAGNOSIS — M9901 Segmental and somatic dysfunction of cervical region: Secondary | ICD-10-CM | POA: Diagnosis not present

## 2016-12-17 DIAGNOSIS — M9905 Segmental and somatic dysfunction of pelvic region: Secondary | ICD-10-CM | POA: Diagnosis not present

## 2016-12-17 DIAGNOSIS — M9903 Segmental and somatic dysfunction of lumbar region: Secondary | ICD-10-CM | POA: Diagnosis not present

## 2016-12-17 DIAGNOSIS — M7632 Iliotibial band syndrome, left leg: Secondary | ICD-10-CM | POA: Diagnosis not present

## 2016-12-24 DIAGNOSIS — K080 Exfoliation of teeth due to systemic causes: Secondary | ICD-10-CM | POA: Diagnosis not present

## 2017-01-11 DIAGNOSIS — M9905 Segmental and somatic dysfunction of pelvic region: Secondary | ICD-10-CM | POA: Diagnosis not present

## 2017-01-11 DIAGNOSIS — M9903 Segmental and somatic dysfunction of lumbar region: Secondary | ICD-10-CM | POA: Diagnosis not present

## 2017-01-11 DIAGNOSIS — M9901 Segmental and somatic dysfunction of cervical region: Secondary | ICD-10-CM | POA: Diagnosis not present

## 2017-01-11 DIAGNOSIS — M7632 Iliotibial band syndrome, left leg: Secondary | ICD-10-CM | POA: Diagnosis not present

## 2017-02-12 DIAGNOSIS — M9901 Segmental and somatic dysfunction of cervical region: Secondary | ICD-10-CM | POA: Diagnosis not present

## 2017-02-12 DIAGNOSIS — M9905 Segmental and somatic dysfunction of pelvic region: Secondary | ICD-10-CM | POA: Diagnosis not present

## 2017-02-12 DIAGNOSIS — M9903 Segmental and somatic dysfunction of lumbar region: Secondary | ICD-10-CM | POA: Diagnosis not present

## 2017-02-12 DIAGNOSIS — M7632 Iliotibial band syndrome, left leg: Secondary | ICD-10-CM | POA: Diagnosis not present

## 2017-04-01 DIAGNOSIS — M9901 Segmental and somatic dysfunction of cervical region: Secondary | ICD-10-CM | POA: Diagnosis not present

## 2017-04-01 DIAGNOSIS — M9903 Segmental and somatic dysfunction of lumbar region: Secondary | ICD-10-CM | POA: Diagnosis not present

## 2017-04-01 DIAGNOSIS — M7632 Iliotibial band syndrome, left leg: Secondary | ICD-10-CM | POA: Diagnosis not present

## 2017-04-01 DIAGNOSIS — M9905 Segmental and somatic dysfunction of pelvic region: Secondary | ICD-10-CM | POA: Diagnosis not present

## 2017-05-07 DIAGNOSIS — M7632 Iliotibial band syndrome, left leg: Secondary | ICD-10-CM | POA: Diagnosis not present

## 2017-05-07 DIAGNOSIS — M9903 Segmental and somatic dysfunction of lumbar region: Secondary | ICD-10-CM | POA: Diagnosis not present

## 2017-05-07 DIAGNOSIS — M9905 Segmental and somatic dysfunction of pelvic region: Secondary | ICD-10-CM | POA: Diagnosis not present

## 2017-05-07 DIAGNOSIS — M9901 Segmental and somatic dysfunction of cervical region: Secondary | ICD-10-CM | POA: Diagnosis not present

## 2017-06-11 DIAGNOSIS — M9905 Segmental and somatic dysfunction of pelvic region: Secondary | ICD-10-CM | POA: Diagnosis not present

## 2017-06-11 DIAGNOSIS — M9903 Segmental and somatic dysfunction of lumbar region: Secondary | ICD-10-CM | POA: Diagnosis not present

## 2017-06-11 DIAGNOSIS — M9901 Segmental and somatic dysfunction of cervical region: Secondary | ICD-10-CM | POA: Diagnosis not present

## 2017-06-11 DIAGNOSIS — M7632 Iliotibial band syndrome, left leg: Secondary | ICD-10-CM | POA: Diagnosis not present

## 2017-07-02 DIAGNOSIS — M9903 Segmental and somatic dysfunction of lumbar region: Secondary | ICD-10-CM | POA: Diagnosis not present

## 2017-07-02 DIAGNOSIS — M7632 Iliotibial band syndrome, left leg: Secondary | ICD-10-CM | POA: Diagnosis not present

## 2017-07-02 DIAGNOSIS — M9905 Segmental and somatic dysfunction of pelvic region: Secondary | ICD-10-CM | POA: Diagnosis not present

## 2017-07-02 DIAGNOSIS — M9901 Segmental and somatic dysfunction of cervical region: Secondary | ICD-10-CM | POA: Diagnosis not present

## 2017-07-05 DIAGNOSIS — K080 Exfoliation of teeth due to systemic causes: Secondary | ICD-10-CM | POA: Diagnosis not present

## 2017-08-19 DIAGNOSIS — M9901 Segmental and somatic dysfunction of cervical region: Secondary | ICD-10-CM | POA: Diagnosis not present

## 2017-08-19 DIAGNOSIS — M9903 Segmental and somatic dysfunction of lumbar region: Secondary | ICD-10-CM | POA: Diagnosis not present

## 2017-08-19 DIAGNOSIS — M9905 Segmental and somatic dysfunction of pelvic region: Secondary | ICD-10-CM | POA: Diagnosis not present

## 2017-08-19 DIAGNOSIS — M7632 Iliotibial band syndrome, left leg: Secondary | ICD-10-CM | POA: Diagnosis not present

## 2017-09-06 ENCOUNTER — Encounter: Payer: Self-pay | Admitting: Emergency Medicine

## 2017-09-06 ENCOUNTER — Emergency Department
Admission: EM | Admit: 2017-09-06 | Discharge: 2017-09-06 | Disposition: A | Discharge status: Home / Self Care | Payer: Federal, State, Local not specified - PPO | Source: Home / Self Care | Attending: Family Medicine | Admitting: Family Medicine

## 2017-09-06 DIAGNOSIS — J069 Acute upper respiratory infection, unspecified: Secondary | ICD-10-CM

## 2017-09-06 DIAGNOSIS — B9789 Other viral agents as the cause of diseases classified elsewhere: Secondary | ICD-10-CM

## 2017-09-06 MED ORDER — AZITHROMYCIN 250 MG PO TABS: ORAL_TABLET | ORAL | 0 refills | Status: DC

## 2017-09-06 NOTE — ED Triage Notes (Signed)
Pt c/o sinus pressure and HA x2 days.

## 2017-09-06 NOTE — Discharge Instructions (Addendum)
Take plain guaifenesin (1200mg  extended release tabs such as Mucinex) twice daily, with plenty of water, for cough and congestion.  May add Pseudoephedrine (30mg , one or two every 4 to 6 hours) for sinus congestion.  Get adequate rest.   May use Afrin nasal spray (or generic oxymetazoline) each morning for about 5 days and then discontinue.  Also recommend using saline nasal spray several times daily and saline nasal irrigation (AYR is a common brand).  Use Flonase nasal spray each morning after using Afrin nasal spray and saline nasal irrigation. Try warm salt water gargles for sore throat.  Stop all antihistamines for now, and other non-prescription cough/cold preparations. May take Ibuprofen 200mg , 4 tabs every 8 hours with food for body aches, fever, etc. May take Delsym Cough Suppressant at bedtime for nighttime cough.  Begin Azithromycin if not improving about one week or if persistent fever develops Follow-up with family doctor if not improving about10 days.

## 2017-09-06 NOTE — ED Provider Notes (Signed)
[facility]    CSN: [id number] Arrival date & time: 09/06/17  1323     History   Chief Complaint Chief Complaint  Patient presents with  . Facial Pain    HPI Terry Jenkins is a 47 y.o. male.   Patient complains of three day history of typical cold-like symptoms developing over several days, including mild sore throat, sinus congestion, fatigue, low grade fever, and cough.   The history is provided by the patient.    Past Medical History:  Diagnosis Date  . Anxiety   . Hypercholesteremia   . Marijuana smoker in remission ([place])   . Varicocele     Patient Active Problem List   Diagnosis Date Noted  . Varicocele 07/22/2016  . Nocturia 07/08/2016  . Hypogonadism in male 07/08/2016  . Left shoulder pain 06/09/2016  . Ischial bursitis of right side 07/04/2013    Past Surgical History:  Procedure Laterality Date  . ANAL FISSURE REPAIR    . HERNIA REPAIR         Home Medications    Prior to Admission medications   Medication Sig Start Date End Date Taking? Authorizing Provider  azithromycin (ZITHROMAX Z-PAK) 250 MG tablet Take 2 tabs today; then begin one tab once daily for 4 more days. (Rx void after 09/14/17) 09/06/17   Kandra Nicolas, MD    Family History Family History  Problem Relation Age of Onset  . Brain cancer Father   . Hypertension Mother   . Hyperlipidemia Mother   . Prostate cancer Paternal Uncle   . Colon cancer Maternal Grandmother     Social History Social History   Tobacco Use  . Smoking status: Former Smoker    Packs/day: 1.00    Years: 15.00    Pack years: 15.00    Types: Cigarettes    Last attempt to quit: 03/04/2004    Years since quitting: 13.5  . Smokeless tobacco: Former Systems developer    Types: Snuff  Substance Use Topics  . Alcohol use: No  . Drug use: No     Allergies   Ampicillin   Review of Systems Review of Systems + sore throat + cough No pleuritic pain No wheezing + nasal congestion + post-nasal  drainage + sinus pain/pressure No itchy/red eyes ? earache No hemoptysis No SOB + low grade fever, + chills No nausea No vomiting No abdominal pain No diarrhea No urinary symptoms No skin rash + fatigue + myalgias No headache Used OTC meds without relief   Physical Exam Triage Vital Signs ED Triage Vitals [09/06/17 1401]  Enc Vitals Group     BP 129/80     Pulse Rate 68     Resp      Temp 98.3 F (36.8 C)     Temp Source Oral     SpO2 98 %     Weight 156 lb (70.8 kg)     Height      Head Circumference      Peak Flow      Pain Score 0     Pain Loc      Pain Edu?      Excl. in [place]?    No data found.  Updated Vital Signs BP 129/80 (BP Location: Right Arm)   Pulse 68   Temp 98.3 F (36.8 C) (Oral)   Wt 156 lb (70.8 kg)   SpO2 98%   BMI 23.04 kg/m   Visual Acuity Right Eye Distance:   Left  Eye Distance:   Bilateral Distance:    Right Eye Near:   Left Eye Near:    Bilateral Near:     Physical Exam Nursing notes and Vital Signs reviewed. Appearance:  Patient appears stated age, and in no acute distress Eyes:  Pupils are equal, round, and reactive to light and accomodation.  Extraocular movement is intact.  Conjunctivae are not inflamed  Ears:  Canals normal.  Tympanic membranes normal.  Nose:  Mildly congested turbinates.  No sinus tenderness.   Pharynx:  Normal Neck:  Supple.  Enlarged posterior/lateral nodes are palpated bilaterally, tender to palpation on the left.   Lungs:  Clear to auscultation.  Breath sounds are equal.  Moving air well. Heart:  Regular rate and rhythm without murmurs, rubs, or gallops.  Abdomen:  Nontender without masses or hepatosplenomegaly.  Bowel sounds are present.  No CVA or flank tenderness.  Extremities:  No edema.  Skin:  No rash present.     UC Treatments / Results  Labs (all labs ordered are listed, but only abnormal results are displayed) Labs Reviewed - No data to display  EKG None  Radiology No results  found.  Procedures Procedures (including critical care time)  Medications Ordered in UC Medications - No data to display  Initial Impression / Assessment and Plan / UC Course  I have reviewed the triage vital signs and the nursing notes.  Pertinent labs & imaging results that were available during my care of the patient were reviewed by me and considered in my medical decision making (see chart for details).    There is no evidence of bacterial infection today.  Treat symptomatically for now  Followup with Family Doctor if not improved in about 10 days.   Final Clinical Impressions(s) / UC Diagnoses   Final diagnoses:  Viral URI with cough     Discharge Instructions     Take plain guaifenesin (1200mg  extended release tabs such as Mucinex) twice daily, with plenty of water, for cough and congestion.  May add Pseudoephedrine (30mg , one or two every 4 to 6 hours) for sinus congestion.  Get adequate rest.   May use Afrin nasal spray (or generic oxymetazoline) each morning for about 5 days and then discontinue.  Also recommend using saline nasal spray several times daily and saline nasal irrigation (AYR is a common brand).  Use Flonase nasal spray each morning after using Afrin nasal spray and saline nasal irrigation. Try warm salt water gargles for sore throat.  Stop all antihistamines for now, and other non-prescription cough/cold preparations. May take Ibuprofen 200mg , 4 tabs every 8 hours with food for body aches, fever, etc. May take Delsym Cough Suppressant at bedtime for nighttime cough.  Begin Azithromycin if not improving about one week or if persistent fever develops  (Given a prescription to hold, with an expiration date)   Follow-up with family doctor if not improving about 10 days.     ED Prescriptions    Medication Sig Dispense Auth. Provider   azithromycin (ZITHROMAX Z-PAK) 250 MG tablet Take 2 tabs today; then begin one tab once daily for 4 more days. (Rx void after  09/14/17) 6 tablet Kandra Nicolas, MD        Kandra Nicolas, MD 09/08/17 [phone]

## 2017-09-17 DIAGNOSIS — M7632 Iliotibial band syndrome, left leg: Secondary | ICD-10-CM | POA: Diagnosis not present

## 2017-09-17 DIAGNOSIS — M9903 Segmental and somatic dysfunction of lumbar region: Secondary | ICD-10-CM | POA: Diagnosis not present

## 2017-09-17 DIAGNOSIS — M9901 Segmental and somatic dysfunction of cervical region: Secondary | ICD-10-CM | POA: Diagnosis not present

## 2017-09-17 DIAGNOSIS — M9905 Segmental and somatic dysfunction of pelvic region: Secondary | ICD-10-CM | POA: Diagnosis not present

## 2017-10-15 DIAGNOSIS — M9901 Segmental and somatic dysfunction of cervical region: Secondary | ICD-10-CM | POA: Diagnosis not present

## 2017-10-15 DIAGNOSIS — M9905 Segmental and somatic dysfunction of pelvic region: Secondary | ICD-10-CM | POA: Diagnosis not present

## 2017-10-15 DIAGNOSIS — M7632 Iliotibial band syndrome, left leg: Secondary | ICD-10-CM | POA: Diagnosis not present

## 2017-10-15 DIAGNOSIS — M9903 Segmental and somatic dysfunction of lumbar region: Secondary | ICD-10-CM | POA: Diagnosis not present

## 2017-11-26 DIAGNOSIS — M9903 Segmental and somatic dysfunction of lumbar region: Secondary | ICD-10-CM | POA: Diagnosis not present

## 2017-11-26 DIAGNOSIS — M7632 Iliotibial band syndrome, left leg: Secondary | ICD-10-CM | POA: Diagnosis not present

## 2017-11-26 DIAGNOSIS — M9905 Segmental and somatic dysfunction of pelvic region: Secondary | ICD-10-CM | POA: Diagnosis not present

## 2017-11-26 DIAGNOSIS — M9901 Segmental and somatic dysfunction of cervical region: Secondary | ICD-10-CM | POA: Diagnosis not present

## 2018-01-21 DIAGNOSIS — M9902 Segmental and somatic dysfunction of thoracic region: Secondary | ICD-10-CM | POA: Diagnosis not present

## 2018-01-21 DIAGNOSIS — M9905 Segmental and somatic dysfunction of pelvic region: Secondary | ICD-10-CM | POA: Diagnosis not present

## 2018-01-21 DIAGNOSIS — M9903 Segmental and somatic dysfunction of lumbar region: Secondary | ICD-10-CM | POA: Diagnosis not present

## 2018-01-21 DIAGNOSIS — M9901 Segmental and somatic dysfunction of cervical region: Secondary | ICD-10-CM | POA: Diagnosis not present

## 2018-01-26 DIAGNOSIS — M9903 Segmental and somatic dysfunction of lumbar region: Secondary | ICD-10-CM | POA: Diagnosis not present

## 2018-01-26 DIAGNOSIS — M9902 Segmental and somatic dysfunction of thoracic region: Secondary | ICD-10-CM | POA: Diagnosis not present

## 2018-01-26 DIAGNOSIS — M9901 Segmental and somatic dysfunction of cervical region: Secondary | ICD-10-CM | POA: Diagnosis not present

## 2018-01-26 DIAGNOSIS — M9905 Segmental and somatic dysfunction of pelvic region: Secondary | ICD-10-CM | POA: Diagnosis not present

## 2018-02-07 DIAGNOSIS — M9905 Segmental and somatic dysfunction of pelvic region: Secondary | ICD-10-CM | POA: Diagnosis not present

## 2018-02-07 DIAGNOSIS — M9903 Segmental and somatic dysfunction of lumbar region: Secondary | ICD-10-CM | POA: Diagnosis not present

## 2018-02-07 DIAGNOSIS — M9902 Segmental and somatic dysfunction of thoracic region: Secondary | ICD-10-CM | POA: Diagnosis not present

## 2018-02-07 DIAGNOSIS — M9901 Segmental and somatic dysfunction of cervical region: Secondary | ICD-10-CM | POA: Diagnosis not present

## 2018-02-17 DIAGNOSIS — K080 Exfoliation of teeth due to systemic causes: Secondary | ICD-10-CM | POA: Diagnosis not present

## 2018-03-09 DIAGNOSIS — M9905 Segmental and somatic dysfunction of pelvic region: Secondary | ICD-10-CM | POA: Diagnosis not present

## 2018-03-09 DIAGNOSIS — M9903 Segmental and somatic dysfunction of lumbar region: Secondary | ICD-10-CM | POA: Diagnosis not present

## 2018-03-09 DIAGNOSIS — M9901 Segmental and somatic dysfunction of cervical region: Secondary | ICD-10-CM | POA: Diagnosis not present

## 2018-03-09 DIAGNOSIS — M9902 Segmental and somatic dysfunction of thoracic region: Secondary | ICD-10-CM | POA: Diagnosis not present

## 2018-04-06 DIAGNOSIS — M9905 Segmental and somatic dysfunction of pelvic region: Secondary | ICD-10-CM | POA: Diagnosis not present

## 2018-04-06 DIAGNOSIS — M9902 Segmental and somatic dysfunction of thoracic region: Secondary | ICD-10-CM | POA: Diagnosis not present

## 2018-04-06 DIAGNOSIS — M9901 Segmental and somatic dysfunction of cervical region: Secondary | ICD-10-CM | POA: Diagnosis not present

## 2018-04-06 DIAGNOSIS — M9903 Segmental and somatic dysfunction of lumbar region: Secondary | ICD-10-CM | POA: Diagnosis not present

## 2019-01-22 ENCOUNTER — Encounter: Payer: Self-pay | Admitting: Emergency Medicine

## 2019-01-22 ENCOUNTER — Other Ambulatory Visit: Payer: Self-pay

## 2019-01-22 ENCOUNTER — Emergency Department
Admission: EM | Admit: 2019-01-22 | Discharge: 2019-01-22 | Disposition: A | Discharge status: Home / Self Care | Payer: Federal, State, Local not specified - PPO | Source: Home / Self Care

## 2019-01-22 DIAGNOSIS — R6889 Other general symptoms and signs: Secondary | ICD-10-CM

## 2019-01-22 DIAGNOSIS — R110 Nausea: Secondary | ICD-10-CM | POA: Diagnosis not present

## 2019-01-22 DIAGNOSIS — R197 Diarrhea, unspecified: Secondary | ICD-10-CM | POA: Diagnosis not present

## 2019-01-22 DIAGNOSIS — Z20828 Contact with and (suspected) exposure to other viral communicable diseases: Secondary | ICD-10-CM | POA: Diagnosis not present

## 2019-01-22 LAB — POCT INFLUENZA A/B
Influenza A, POC: NEGATIVE
Influenza B, POC: NEGATIVE

## 2019-01-22 NOTE — ED Provider Notes (Signed)
Terry Jenkins CARE    CSN: [id number] Arrival date & time: 01/22/19  1408      History   Chief Complaint Chief Complaint  Patient presents with  . body aches    HPI Terry Jenkins is a 48 y.o. male.   HPI  Terry Jenkins is a 48 y.o. male presenting to UC with c/o 3-4 days of body aches, nausea, diarrhea, and decreased appetite. He had to call out of work due to feeling bad, work advised him to be tested for Covid-19, he works for the [employer] and cannot return to work without at least 1 negative result.  He denies fever, chills, vomiting, cough or congestion.  He does recall eating at a fast food restaurant the day of the GI symptoms but notes his fatigue and body aches started the night before.  He has not taken any OTC medications. He is gradually feeling better each day. No known sick contacts or recent travel.    Past Medical History:  Diagnosis Date  . Anxiety   . Hypercholesteremia   . Marijuana smoker in remission ([place])   . Varicocele     Patient Active Problem List   Diagnosis Date Noted  . Varicocele 07/22/2016  . Nocturia 07/08/2016  . Hypogonadism in male 07/08/2016  . Left shoulder pain 06/09/2016  . Ischial bursitis of right side 07/04/2013    Past Surgical History:  Procedure Laterality Date  . ANAL FISSURE REPAIR    . HERNIA REPAIR         Home Medications    Prior to Admission medications   Medication Sig Start Date End Date Taking? Authorizing Provider  azithromycin (ZITHROMAX Z-PAK) 250 MG tablet Take 2 tabs today; then begin one tab once daily for 4 more days. (Rx void after 09/14/17) 09/06/17   Kandra Nicolas, MD    Family History Family History  Problem Relation Age of Onset  . Brain cancer Father   . Hypertension Mother   . Hyperlipidemia Mother   . Prostate cancer Paternal Uncle   . Colon cancer Maternal Grandmother     Social History Social History   Tobacco Use  . Smoking status: Former Smoker    Packs/day: 1.00   Years: 15.00    Pack years: 15.00    Types: Cigarettes    Quit date: 03/04/2004    Years since quitting: 14.8  . Smokeless tobacco: Former Systems developer    Types: Snuff  Substance Use Topics  . Alcohol use: No  . Drug use: No     Allergies   Ampicillin   Review of Systems Review of Systems  Constitutional: Positive for appetite change and fatigue. Negative for chills and fever.  HENT: Negative for congestion, ear pain, sore throat, trouble swallowing and voice change.   Respiratory: Negative for cough and shortness of breath.   Cardiovascular: Negative for chest pain and palpitations.  Gastrointestinal: Positive for diarrhea and nausea. Negative for abdominal pain and vomiting.  Musculoskeletal: Positive for arthralgias, back pain and myalgias.  Skin: Negative for rash.     Physical Exam Triage Vital Signs ED Triage Vitals  Enc Vitals Group     BP 01/22/19 1536 (!) 144/83     Pulse Rate 01/22/19 1536 60     Resp 01/22/19 1536 17     Temp 01/22/19 1536 (!) 97.5 F (36.4 C)     Temp Source 01/22/19 1536 Oral     SpO2 01/22/19 1536 98 %  Weight 01/22/19 1536 150 lb (68 kg)     Height 01/22/19 1536 5\' 9"  (1.753 m)     Head Circumference --      Peak Flow --      Pain Score 01/22/19 1541 0     Pain Loc --      Pain Edu? --      Excl. in [place]? --    No data found.  Updated Vital Signs BP (!) 144/83 (BP Location: Right Arm)   Pulse 60   Temp (!) 97.5 F (36.4 C) (Oral)   Resp 17   Ht 5\' 9"  (1.753 m)   Wt 150 lb (68 kg)   SpO2 98%   BMI 22.15 kg/m   Visual Acuity Right Eye Distance:   Left Eye Distance:   Bilateral Distance:    Right Eye Near:   Left Eye Near:    Bilateral Near:     Physical Exam Vitals signs and nursing note reviewed.  Constitutional:      Appearance: Normal appearance. He is well-developed.  HENT:     Head: Normocephalic and atraumatic.     Right Ear: Tympanic membrane normal.     Left Ear: Tympanic membrane normal.     Nose: Nose  normal.     Right Sinus: No maxillary sinus tenderness or frontal sinus tenderness.     Left Sinus: No maxillary sinus tenderness or frontal sinus tenderness.     Mouth/Throat:     Lips: Pink.     Mouth: Mucous membranes are moist.     Pharynx: Oropharynx is clear. Uvula midline.  Neck:     Musculoskeletal: Normal range of motion.  Cardiovascular:     Rate and Rhythm: Normal rate and regular rhythm.  Pulmonary:     Effort: Pulmonary effort is normal.     Breath sounds: Normal breath sounds.  Abdominal:     General: There is no distension.     Palpations: Abdomen is soft.     Tenderness: There is no abdominal tenderness. There is no right CVA tenderness or left CVA tenderness.  Musculoskeletal: Normal range of motion.  Skin:    General: Skin is warm and dry.  Neurological:     Mental Status: He is alert and oriented to person, place, and time.  Psychiatric:        Behavior: Behavior normal.      UC Treatments / Results  Labs (all labs ordered are listed, but only abnormal results are displayed) Labs Reviewed  NOVEL CORONAVIRUS, NAA  POCT INFLUENZA A/B    EKG   Radiology No results found.  Procedures Procedures (including critical care time)  Medications Ordered in UC Medications - No data to display  Initial Impression / Assessment and Plan / UC Course  I have reviewed the triage vital signs and the nursing notes.  Pertinent labs & imaging results that were available during my care of the patient were reviewed by me and considered in my medical decision making (see chart for details).     Pt appears well, NAD Benign abdominal exam No evidence of underlying bacterial infection at this time. Rapid flu test: NEGATIVE Covid-19 test: Pending.  Final Clinical Impressions(s) / UC Diagnoses   Final diagnoses:  Flu-like symptoms     Discharge Instructions      Due to concern for possibly having Covid-19, it is advised that you self-isolate at home until  test results come back.  If positive, it is recommended you stay isolated  for at least 10 days after symptom onset and 24 days after last fever without taking medication (whichever is longer).  If you MUST go out, please wear a mask at all times, limit contact with others.      ED Prescriptions    None     PDMP not reviewed this encounter.   Noe Gens, [place] 01/23/19 [phone]

## 2019-01-22 NOTE — ED Triage Notes (Signed)
Patient c/o body aches, nausea, diarrhea, fever a couple of days ago, decreased appetite.

## 2019-01-22 NOTE — Discharge Instructions (Signed)
°  Due to concern for possibly having Covid-19, it is advised that you self-isolate at home until test results come back.  If positive, it is recommended you stay isolated for at least 10 days after symptom onset and 24 days after last fever without taking medication (whichever is longer).  If you MUST go out, please wear a mask at all times, limit contact with others.

## 2019-01-24 LAB — NOVEL CORONAVIRUS, NAA: SARS-CoV-2, NAA: NOT DETECTED

## 2019-01-25 ENCOUNTER — Encounter (HOSPITAL_COMMUNITY): Payer: Self-pay

## 2019-08-23 ENCOUNTER — Emergency Department
Admission: EM | Admit: 2019-08-23 | Discharge: 2019-08-23 | Disposition: A | Discharge status: Home / Self Care | Payer: Federal, State, Local not specified - PPO | Source: Home / Self Care

## 2019-08-23 ENCOUNTER — Other Ambulatory Visit: Payer: Self-pay

## 2019-08-23 ENCOUNTER — Emergency Department (INDEPENDENT_AMBULATORY_CARE_PROVIDER_SITE_OTHER): Payer: Federal, State, Local not specified - PPO

## 2019-08-23 ENCOUNTER — Encounter: Payer: Self-pay | Admitting: Emergency Medicine

## 2019-08-23 DIAGNOSIS — S9032XA Contusion of left foot, initial encounter: Secondary | ICD-10-CM | POA: Diagnosis not present

## 2019-08-23 DIAGNOSIS — M25572 Pain in left ankle and joints of left foot: Secondary | ICD-10-CM | POA: Diagnosis not present

## 2019-08-23 DIAGNOSIS — S9702XA Crushing injury of left ankle, initial encounter: Secondary | ICD-10-CM | POA: Diagnosis not present

## 2019-08-23 DIAGNOSIS — S9782XA Crushing injury of left foot, initial encounter: Secondary | ICD-10-CM | POA: Diagnosis not present

## 2019-08-23 DIAGNOSIS — M79672 Pain in left foot: Secondary | ICD-10-CM

## 2019-08-23 MED ORDER — IBUPROFEN 600 MG PO TABS
600.0000 mg | ORAL_TABLET | Freq: Once | ORAL | Status: AC
  Administered 2019-08-23: 17:00:00 600 mg via ORAL

## 2019-08-23 NOTE — ED Provider Notes (Signed)
Terry Jenkins CARE    CSN: [id number] Arrival date & time: 08/23/19  1621      History   Chief Complaint Chief Complaint  Patient presents with  . Ankle Injury    left  . Ankle Pain    left    HPI Terry Jenkins is a 49 y.o. male.   HPI Terry Jenkins is a 48 y.o. male presenting to UC with c/o gradually worsening Left anterior ankle and Left foot pain.  Around 12:30PM a log hit him while he was splitting wood while wearing sneakers instead of his boots. He did not have pain initially but about 1 hour later he developed pain while walking at work.  Pain is sharp, worse with ambulation and standing.  No pain medication taken PTA.   Past Medical History:  Diagnosis Date  . Anxiety   . Hypercholesteremia   . 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 Varicocele     Patient Active Problem List   Diagnosis Date Noted  . Varicocele 07/22/2016  . Nocturia 07/08/2016  . Hypogonadism in male 07/08/2016  . Left shoulder pain 06/09/2016  . Ischial bursitis of right side 07/04/2013    Past Surgical History:  Procedure Laterality Date  . ANAL FISSURE REPAIR    . HERNIA REPAIR         Home Medications    Prior to Admission medications   Medication Sig Start Date End Date Taking? Authorizing Provider  azithromycin (ZITHROMAX Z-PAK) 250 MG tablet Take 2 tabs today; then begin one tab once daily for 4 more days. (Rx void after 09/14/17) 09/06/17   Kandra Nicolas, MD    Family History Family History  Problem Relation Age of Onset  . Brain cancer Father   . Hypertension Mother   . Hyperlipidemia Mother   . Prostate cancer Paternal Uncle   . Colon cancer Maternal Grandmother   . Healthy Sister     Social History Social History   Tobacco Use  . Smoking status: Former Smoker    Packs/day: 1.00    Years: 15.00    Pack years: 15.00    Types: Cigarettes    Quit date: 03/04/2004    Years since quitting: 15.4  . Smokeless tobacco: Current User    Types: Snuff  .  Tobacco comment: started snuff in 2019  Substance Use Topics  . Alcohol use: No  . Drug use: No     Allergies   Ampicillin   Review of Systems Review of Systems  Musculoskeletal: Positive for arthralgias, gait problem, joint swelling and myalgias.  Skin: Negative for color change and wound.     Physical Exam Triage Vital Signs ED Triage Vitals  Enc Vitals Group     BP 08/23/19 1637 133/84     Pulse Rate 08/23/19 1637 82     Resp 08/23/19 1637 17     Temp 08/23/19 1637 98 F (36.7 C)     Temp Source 08/23/19 1637 Oral     SpO2 08/23/19 1637 97 %     Weight --      Height --      Head Circumference --      Peak Flow --      Pain Score 08/23/19 1638 7     Pain Loc --      Pain Edu? --      Excl. in [place]? --    No data found.  Updated Vital Signs BP 133/84 (BP Location:  Right Arm)   Pulse 82   Temp 98 F (36.7 C) (Oral)   Resp 17   SpO2 97%   Visual Acuity Right Eye Distance:   Left Eye Distance:   Bilateral Distance:    Right Eye Near:   Left Eye Near:    Bilateral Near:     Physical Exam Vitals and nursing note reviewed.  Constitutional:      Appearance: He is well-developed.  HENT:     Head: Normocephalic and atraumatic.  Cardiovascular:     Rate and Rhythm: Normal rate.     Pulses:          Dorsalis pedis pulses are 2+ on the left side.  Pulmonary:     Effort: Pulmonary effort is normal.  Musculoskeletal:        General: Swelling and tenderness ( Left anterior ankle/proximal dorsum of foot) present. Normal range of motion.     Cervical back: Normal range of motion.       Legs:  Skin:    General: Skin is warm and dry.     Findings: No bruising or erythema.  Neurological:     Mental Status: He is alert and oriented to person, place, and time.     Sensory: No sensory deficit.  Psychiatric:        Behavior: Behavior normal.      UC Treatments / Results  Labs (all labs ordered are listed, but only abnormal results are displayed) Labs  Reviewed - No data to display  EKG   Radiology DG Ankle Complete Left  Result Date: 08/23/2019 CLINICAL DATA:  Left lateral ankle pain, crush injury EXAM: LEFT ANKLE COMPLETE - 3+ VIEW COMPARISON:  09/26/2010 FINDINGS: Frontal, oblique, and lateral views of the left ankle are obtained. No fracture, subluxation, or dislocation. Joint spaces are well preserved. Soft tissues are normal. Prominent inferior calcaneal spur unchanged. IMPRESSION: 1. No acute bony abnormality. Electronically Signed   By: Randa Ngo M.D.   On: 08/23/2019 17:34   DG Foot Complete Left  Result Date: 08/23/2019 CLINICAL DATA:  Crush injury, left lateral foot pain EXAM: LEFT FOOT - COMPLETE 3+ VIEW COMPARISON:  09/26/2010 FINDINGS: Frontal, oblique, lateral views of the left foot are obtained. Hammertoe deformities are noted. No acute fracture, subluxation, or dislocation. Inferior calcaneal spur unchanged. IMPRESSION: 1. No acute bony abnormality. Electronically Signed   By: Randa Ngo M.D.   On: 08/23/2019 17:38    Procedures Procedures (including critical care time)  Medications Ordered in UC Medications  ibuprofen (ADVIL) tablet 600 mg (600 mg Oral Given 08/23/19 1716)    Initial Impression / Assessment and Plan / UC Course  I have reviewed the triage vital signs and the nursing notes.  Pertinent labs & imaging results that were available during my care of the patient were reviewed by me and considered in my medical decision making (see chart for details).     Reviewed imaging with pt Reassured pt of no fracture or dislocation. Ankle splint provided for comfort Encouraged conservative treatment F/u with Sports Medicine in 1-2 weeks if not improvign AVS provided  Final Clinical Impressions(s) / UC Diagnoses   Final diagnoses:  Acute left ankle pain  Contusion of left foot, initial encounter  Left foot pain     Discharge Instructions      You may take 500mg  acetaminophen every 4-6 hours  or in combination with ibuprofen 400-600mg  every 6-8 hours as needed for pain and inflammation.  Please call to  schedule a follow up with Dr. Dianah Field, Sports Medicine in 1-2 weeks if not improving.    ED Prescriptions    None     PDMP not reviewed this encounter.   Noe Gens, [place] 08/23/19 1819

## 2019-08-23 NOTE — Discharge Instructions (Signed)
  You may take 500mg  acetaminophen every 4-6 hours or in combination with ibuprofen 400-600mg  every 6-8 hours as needed for pain and inflammation.  Please call to schedule a follow up with Dr. Dianah Field, Sports Medicine in 1-2 weeks if not improving.

## 2019-08-23 NOTE — ED Notes (Signed)
Pt waiting on radiology. Updated by RN

## 2019-08-23 NOTE — ED Triage Notes (Signed)
Pt hit outside of left ankle at 1230 today - not sure if a log hit it or something - no initial pain until 1 hr later- pain only with walking or standing

## 2020-11-03 ENCOUNTER — Encounter: Payer: Self-pay | Admitting: Emergency Medicine

## 2020-11-03 ENCOUNTER — Other Ambulatory Visit: Payer: Self-pay

## 2020-11-03 ENCOUNTER — Emergency Department
Admission: EM | Admit: 2020-11-03 | Discharge: 2020-11-03 | Disposition: A | Discharge status: Home / Self Care | Payer: Federal, State, Local not specified - PPO | Source: Home / Self Care

## 2020-11-03 DIAGNOSIS — J069 Acute upper respiratory infection, unspecified: Secondary | ICD-10-CM | POA: Diagnosis not present

## 2020-11-03 HISTORY — DX: Other seasonal allergic rhinitis: J30.2

## 2020-11-03 MED ORDER — BENZONATATE 200 MG PO CAPS: 200.0000 mg | ORAL_CAPSULE | Freq: Three times a day (TID) | ORAL | 0 refills | Status: AC | PRN

## 2020-11-03 MED ORDER — METHYLPREDNISOLONE 4 MG PO TBPK: ORAL_TABLET | ORAL | 0 refills | Status: DC

## 2020-11-03 NOTE — ED Provider Notes (Signed)
Vinnie Langton CARE    CSN: [id number] Arrival date & time: 11/03/20  1253      History   Chief Complaint Chief Complaint  Patient presents with   Nasal Congestion   Cough   Generalized Body Aches    HPI CAMPBELL KRAY is a 50 y.o. male.   HPI 50 year old male presents with congestion, headache, cough and myalgias for 4 days.  Patient is only been vaccinated once for COVID-19 and request work note for the past 4 days.  Past Medical History:  Diagnosis Date   Anxiety    Hypercholesteremia    Marijuana smoker in remission [facility])    Seasonal allergies    Varicocele     Patient Active Problem List   Diagnosis Date Noted   Varicocele 07/22/2016   Nocturia 07/08/2016   Hypogonadism in male 07/08/2016   Left shoulder pain 06/09/2016   Ischial bursitis of right side 07/04/2013    Past Surgical History:  Procedure Laterality Date   ANAL FISSURE REPAIR     HERNIA REPAIR         Home Medications    Prior to Admission medications   Medication Sig Start Date End Date Taking? Authorizing Provider  benzonatate (TESSALON) 200 MG capsule Take 1 capsule (200 mg total) by mouth 3 (three) times daily as needed for up to 7 days for cough. 11/03/20 11/10/20 Yes Eliezer Lofts, FNP  methylPREDNISolone (MEDROL DOSEPAK) 4 MG TBPK tablet Take as directed. 11/03/20  Yes Eliezer Lofts, FNP  azithromycin (ZITHROMAX Z-PAK) 250 MG tablet Take 2 tabs today; then begin one tab once daily for 4 more days. (Rx void after 09/14/17) 09/06/17   Assunta Found Ishmael Holter, MD    Family History Family History  Problem Relation Age of Onset   Brain cancer Father    Hypertension Mother    Hyperlipidemia Mother    Prostate cancer Paternal Uncle    Colon cancer Maternal Grandmother    Healthy Sister     Social History Social History   Tobacco Use   Smoking status: Former    Packs/day: 1.00    Years: 15.00    Pack years: 15.00    Types: Cigarettes    Quit date: 03/04/2004    Years since quitting:  16.6   Smokeless tobacco: Current    Types: Snuff   Tobacco comments:    started snuff in 2019  Vaping Use   Vaping Use: Never used  Substance Use Topics   Alcohol use: No   Drug use: No     Allergies   Ampicillin   Review of Systems Review of Systems  HENT:  Positive for congestion.   Respiratory:  Positive for cough.   Musculoskeletal:  Positive for myalgias.  Neurological:  Positive for headaches.  All other systems reviewed and are negative.   Physical Exam Triage Vital Signs ED Triage Vitals [11/03/20 1402]  Enc Vitals Group     BP      Pulse      Resp      Temp      Temp src      SpO2      Weight 155 lb (70.3 kg)     Height 5\' 9"  (1.753 m)     Head Circumference      Peak Flow      Pain Score 3     Pain Loc      Pain Edu?      Excl. in [place]?  No data found.  Updated Vital Signs Ht 5\' 9"  (1.753 m)   Wt 155 lb (70.3 kg)   BMI 22.89 kg/m   Physical Exam Vitals and nursing note reviewed.  Constitutional:      General: He is not in acute distress.    Appearance: Normal appearance. He is normal weight. He is not ill-appearing.  HENT:     Head: Normocephalic and atraumatic.     Right Ear: Tympanic membrane and ear canal normal.     Left Ear: Tympanic membrane and ear canal normal.     Nose: Nose normal.     Mouth/Throat:     Mouth: Mucous membranes are moist.     Pharynx: Oropharynx is clear.  Eyes:     Extraocular Movements: Extraocular movements intact.     Conjunctiva/sclera: Conjunctivae normal.     Pupils: Pupils are equal, round, and reactive to light.  Cardiovascular:     Rate and Rhythm: Normal rate and regular rhythm.     Pulses: Normal pulses.     Heart sounds: Normal heart sounds.  Pulmonary:     Effort: Pulmonary effort is normal. No respiratory distress.     Breath sounds: Normal breath sounds. No wheezing, rhonchi or rales.     Comments: Infrequent nonproductive cough noted on exam Musculoskeletal:        General: Normal  range of motion.     Cervical back: Normal range of motion and neck supple. No tenderness.  Lymphadenopathy:     Cervical: No cervical adenopathy.  Skin:    General: Skin is warm and dry.     Capillary Refill: Capillary refill takes less than 2 seconds.  Neurological:     General: No focal deficit present.     Mental Status: He is alert and oriented to person, place, and time.  Psychiatric:        Mood and Affect: Mood normal.        Behavior: Behavior normal.     UC Treatments / Results  Labs (all labs ordered are listed, but only abnormal results are displayed) Labs Reviewed - No data to display  EKG   Radiology No results found.  Procedures Procedures (including critical care time)  Medications Ordered in UC Medications - No data to display  Initial Impression / Assessment and Plan / UC Course  I have reviewed the triage vital signs and the nursing notes.  Pertinent labs & imaging results that were available during my care of the patient were reviewed by me and considered in my medical decision making (see chart for details).     MDM: 1.  Viral URI with cough-Rx'd Medrol Dosepak and Tessalon Perles, work note provided per patient's request.  Patient discharged home, hemodynamically stable. Final Clinical Impressions(s) / UC Diagnoses   Final diagnoses:  Viral URI with cough     Discharge Instructions      Advised/encouraged patient to start Medrol Dosepak tomorrow morning, Monday, 11/04/2020.  May take [facility] now daily, as needed.  Work note provided for last Wednesday and Thursday per patient's request.     ED Prescriptions     Medication Sig Dispense Auth. Provider   benzonatate (TESSALON) 200 MG capsule Take 1 capsule (200 mg total) by mouth 3 (three) times daily as needed for up to 7 days for cough. 30 capsule Eliezer Lofts, FNP   methylPREDNISolone (MEDROL DOSEPAK) 4 MG TBPK tablet Take as directed. 1 each Eliezer Lofts, FNP      PDMP  not reviewed this encounter.   Eliezer Lofts, [place] 11/03/20 1826

## 2020-11-03 NOTE — ED Triage Notes (Signed)
Patient here day 4 with congestion, mild cough, some aches including headache; had fever one day; tested home for covid today and it was negative. Has had one covid vaccination. Needs work note since out of work past 4 days.

## 2020-11-03 NOTE — Discharge Instructions (Addendum)
Advised/encouraged patient to start Medrol Dosepak tomorrow morning, Monday, 11/04/2020.  May take [facility] now daily, as needed.  Work note provided for last Wednesday and Thursday per patient's request.

## 2021-01-27 ENCOUNTER — Emergency Department (INDEPENDENT_AMBULATORY_CARE_PROVIDER_SITE_OTHER)
Admission: EM | Admit: 2021-01-27 | Discharge: 2021-01-27 | Disposition: A | Discharge status: Home / Self Care | Payer: Federal, State, Local not specified - PPO | Source: Home / Self Care | Attending: Family Medicine | Admitting: Family Medicine

## 2021-01-27 ENCOUNTER — Emergency Department (INDEPENDENT_AMBULATORY_CARE_PROVIDER_SITE_OTHER): Payer: Federal, State, Local not specified - PPO

## 2021-01-27 ENCOUNTER — Encounter: Payer: Self-pay | Admitting: Emergency Medicine

## 2021-01-27 ENCOUNTER — Other Ambulatory Visit: Payer: Self-pay

## 2021-01-27 ENCOUNTER — Emergency Department: Admit: 2021-01-27 | Discharge status: Absent without leave | Payer: Self-pay

## 2021-01-27 DIAGNOSIS — M25572 Pain in left ankle and joints of left foot: Secondary | ICD-10-CM

## 2021-01-27 DIAGNOSIS — S93402A Sprain of unspecified ligament of left ankle, initial encounter: Secondary | ICD-10-CM

## 2021-01-27 DIAGNOSIS — S99912A Unspecified injury of left ankle, initial encounter: Secondary | ICD-10-CM | POA: Diagnosis not present

## 2021-01-27 NOTE — Discharge Instructions (Signed)
Use ice, elevation, and ibuprofen to reduce pain and swelling May take ibuprofen 800 mg 3 times a day with food Wear brace as long as needed.  This might take 4 to 6 weeks. If pain does not improve over the next week or so, follow-up with sports medicine specialist

## 2021-01-27 NOTE — ED Triage Notes (Signed)
Pt c/o left ankle pain that started today after he went running. Has not taken any meds.

## 2021-01-28 NOTE — ED Provider Notes (Signed)
Terry Jenkins CARE    CSN: [id number] Arrival date & time: 01/27/21  1635      History   Chief Complaint Chief Complaint  Patient presents with   Ankle Pain    Pt c/o left ankle pain that started today.     HPI Terry Jenkins is a 50 y.o. male.   HPI Patient twisted his ankle while running.  He states he was able to continue walking until he got home and sat down.  When he stood up he states he can hardly put weight on his foot.  He is pain in the left ankle.  Minimal swelling.  Pain with weightbearing. 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[place])    Seasonal allergies    Varicocele     Patient Active Problem List   Diagnosis Date Noted   Varicocele 07/22/2016   Nocturia 07/08/2016   Hypogonadism in male 07/08/2016   Left shoulder pain 06/09/2016   Ischial bursitis of right side 07/04/2013    Past Surgical History:  Procedure Laterality Date   ANAL FISSURE REPAIR     HERNIA REPAIR         Home Medications    Prior to Admission medications   Not on File    Family History Family History  Problem Relation Age of Onset   Brain cancer Father    Hypertension Mother    Hyperlipidemia Mother    Prostate cancer Paternal Uncle    Colon cancer Maternal Grandmother    Healthy Sister     Social History Social History   Tobacco Use   Smoking status: Former    Packs/day: 1.00    Years: 15.00    Pack years: 15.00    Types: Cigarettes    Quit date: 03/04/2004    Years since quitting: 16.9   Smokeless tobacco: Current    Types: Snuff   Tobacco comments:    started snuff in 2019  Vaping Use   Vaping Use: Never used  Substance Use Topics   Alcohol use: No   Drug use: No     Allergies   Ampicillin   Review of Systems Review of Systems See HPI  Physical Exam Triage Vital Signs ED Triage Vitals  Enc Vitals Group     BP 01/27/21 1657 (!) 144/76     Pulse Rate 01/27/21 1657  96     Resp --      Temp 01/27/21 1657 98.3 F (36.8 C)     Temp Source 01/27/21 1657 Oral     SpO2 01/27/21 1657 98 %     Weight --      Height --      Head Circumference --      Peak Flow --      Pain Score 01/27/21 1658 6     Pain Loc --      Pain Edu? --      Excl. in [place]? --    No data found.  Updated Vital Signs BP (!) 144/76 (BP Location: Right Arm)   Pulse 96   Temp 98.3 F (36.8 C) (Oral)   SpO2 98%      Physical Exam Constitutional:      General: He is not in acute distress.    Appearance: He is well-developed.  HENT:     Head: Normocephalic and atraumatic.  Eyes:     Conjunctiva/sclera: Conjunctivae normal.  Pupils: Pupils are equal, round, and reactive to light.  Cardiovascular:     Rate and Rhythm: Normal rate.  Pulmonary:     Effort: Pulmonary effort is normal. No respiratory distress.  Abdominal:     General: There is no distension.     Palpations: Abdomen is soft.  Musculoskeletal:        General: Normal range of motion.     Cervical back: Normal range of motion.     Comments: Left ankle has minimal swelling of the anterior ankle joint.  Full range of motion.  Tenderness over the midfoot and proximal ankle region.  No tenderness over the lateral or medial malleoli, fifth metatarsal.  No instability  Skin:    General: Skin is warm and dry.  Neurological:     General: No focal deficit present.     Mental Status: He is alert.     Gait: Gait abnormal.  Psychiatric:        Mood and Affect: Mood normal.        Behavior: Behavior normal.     UC Treatments / Results  Labs (all labs ordered are listed, but only abnormal results are displayed) Labs Reviewed - No data to display  EKG   Radiology DG Ankle Complete Left  Result Date: 01/27/2021 CLINICAL DATA:  Lateral pain after twisting injury EXAM: LEFT ANKLE COMPLETE - 3+ VIEW COMPARISON:  08/23/2019 FINDINGS: No acute fracture or dislocation. Talar dome intact. Small calcaneal spur.  IMPRESSION: No acute osseous abnormality. Electronically Signed   By: Abigail Miyamoto M.D.   On: 01/27/2021 18:08    Procedures Procedures (including critical care time)  Medications Ordered in UC Medications - No data to display  Initial Impression / Assessment and Plan / UC Course  I have reviewed the triage vital signs and the nursing notes.  Pertinent labs & imaging results that were available during my care of the patient were reviewed by me and considered in my medical decision making (see chart for details).    Final Clinical Impressions(s) / UC Diagnoses   Final diagnoses:  Acute left ankle pain  Sprain of left ankle, unspecified ligament, initial encounter     Discharge Instructions      Use ice, elevation, and ibuprofen to reduce pain and swelling May take ibuprofen 800 mg 3 times a day with food Wear brace as long as needed.  This might take 4 to 6 weeks. If pain does not improve over the next week or so, follow-up with sports medicine specialist   ED Prescriptions   None    PDMP not reviewed this encounter.   Raylene Everts, MD 01/28/21 [phone]

## 2021-02-26 ENCOUNTER — Telehealth: Payer: Federal, State, Local not specified - PPO | Admitting: Physician Assistant

## 2021-02-26 ENCOUNTER — Encounter: Payer: Self-pay | Admitting: Physician Assistant

## 2021-02-26 DIAGNOSIS — R509 Fever, unspecified: Secondary | ICD-10-CM | POA: Diagnosis not present

## 2021-02-26 MED ORDER — DOXYCYCLINE HYCLATE 100 MG PO TABS: 100.0000 mg | ORAL_TABLET | Freq: Two times a day (BID) | ORAL | 0 refills | Status: DC

## 2021-02-26 NOTE — Patient Instructions (Signed)
  Terry Jenkins, thank you for joining Terry Rio, PA-C for today's virtual visit.  While this provider is not your primary care provider (PCP), if your PCP is located in our provider database this encounter information will be shared with them immediately following your visit.  Consent: (Patient) Terry Jenkins provided verbal consent for this virtual visit at the beginning of the encounter.  Current Medications: No current outpatient medications on file.   Medications ordered in this encounter:  No orders of the defined types were placed in this encounter.    *If you need refills on other medications prior to your next appointment, please contact your pharmacy*  Follow-Up: Call back or seek an in-person evaluation if the symptoms worsen or if the condition fails to improve as anticipated.  Other Instructions Take antibiotic (Doxycycline) as directed.  Increase fluids.  Get plenty of rest. Use Mucinex for congestion. Alternate tylenol and Ibuprofen for fever, headaches. Take a daily probiotic (I recommend Align or Culturelle, but even Activia Yogurt may be beneficial).  A humidifier placed in the bedroom may offer some relief for a dry, scratchy throat of nasal irritation.  Read information below on acute bronchitis. Please call or return to clinic if symptoms are not improving.    If you have been instructed to have an in-person evaluation today at a local Urgent Care facility, please use the link below. It will take you to a list of all of our available [facility], including address, phone number and hours of operation. Please do not delay care.  [facility]  If you or a family member do not have a primary care provider, use the link below to schedule a visit and establish care. When you choose a [facility] primary care physician or advanced practice provider, you gain a long-term partner in health. Find a Primary Care Provider  Learn more about [facility]'s in-office and virtual care options: [place] [facility]

## 2021-02-26 NOTE — Progress Notes (Signed)
Virtual Visit Consent   Terry Jenkins, you are scheduled for a virtual visit with a [facility] provider today.     Just as with appointments in the office, your consent must be obtained to participate.  Your consent will be active for this visit and any virtual visit you may have with one of our providers in the next 365 days.     If you have a MyChart account, a copy of this consent can be sent to you electronically.  All virtual visits are billed to your insurance company just like a traditional visit in the office.    As this is a virtual visit, video technology does not allow for your provider to perform a traditional examination.  This may limit your provider's ability to fully assess your condition.  If your provider identifies any concerns that need to be evaluated in person or the need to arrange testing (such as labs, EKG, etc.), we will make arrangements to do so.     Although advances in technology are sophisticated, we cannot ensure that it will always work on either your end or our end.  If the connection with a video visit is poor, the visit may have to be switched to a telephone visit.  With either a video or telephone visit, we are not always able to ensure that we have a secure connection.     I need to obtain your verbal consent now.   Are you willing to proceed with your visit today?    Terry Jenkins has provided verbal consent on 02/26/2021 for a virtual visit (video or telephone).   Leeanne Rio, [place]   Date: 02/26/2021 3:47 PM   Virtual Visit via Video Note   I, Leeanne Rio, connected with  Terry Jenkins  ([id number], Apr 14, 1971) on 02/26/21 at  3:30 PM EDT by a video-enabled telemedicine application and verified that I am speaking with the correct person using two identifiers.  Location: Patient: Virtual Visit Location Patient: Home Provider: Virtual Visit Location Provider: Home Office   I discussed the limitations of evaluation and management by  telemedicine and the availability of in person appointments. The patient expressed understanding and agreed to proceed.    History of Present Illness: Terry Jenkins is a 50 y.o. who identifies as a male who was assigned male at birth, and is being seen today for febrile illness starting this past Saturday. He notes starting with a fever getting up to a Tmax of 104.2 and going up and down over past 4-5 days. Some headaches, body aches and cough. Denies any glandular swelling. Cough starting last night but mainly 2/2 drainage when laying down. Has had 3 COVID tests which were negative. Denies known flu exposure. Denies abdominal pain, nausea or vomiting. Denies urinary symptoms. Is taking Ibuprofen as needed with fever responding quickly but recurs once medication wears off. Denies sinus pain, ear pain or tooth pain.. Denies chest pain or SOB.  HPI: HPI  Problems:  Patient Active Problem List   Diagnosis Date Noted   Varicocele 07/22/2016   Nocturia 07/08/2016   Hypogonadism in male 07/08/2016   Left shoulder pain 06/09/2016   Ischial bursitis of right side 07/04/2013    Allergies:  Allergies  Allergen Reactions   Ampicillin Rash   Medications:  Current Outpatient Medications:    doxycycline (VIBRA-TABS) 100 MG tablet, Take 1 tablet (100 mg total) by mouth 2 (two) times daily., Disp: 14 tablet, Rfl: 0  Observations/Objective:  Patient is well-developed, well-nourished in no acute distress.  Resting comfortably at home.  Head is normocephalic, atraumatic.  No labored breathing. Speech is clear and coherent with logical content.  Patient is alert and oriented at baseline.   Assessment and Plan: 1. Febrile illness - doxycycline (VIBRA-TABS) 100 MG tablet; Take 1 tablet (100 mg total) by mouth 2 (two) times daily.  Dispense: 14 tablet; Refill: 0 Unclear etiology. Mild respiratory symptoms and persistent fever. COVID negative Outside window for flu antivirals. Will have him continue  supportive measures and start alternating OTC tylenol and ibuprofen for fevers, leeping < 102. Will add on Doxycycline for broad-spectrum coverage since penicillin-allergic. Very low threshold for in office evaluation for any non-resolving, new or worsening symptoms.   Follow Up Instructions: I discussed the assessment and treatment plan with the patient. The patient was provided an opportunity to ask questions and all were answered. The patient agreed with the plan and demonstrated an understanding of the instructions.  A copy of instructions were sent to the patient via MyChart unless otherwise noted below.   The patient was advised to call back or seek an in-person evaluation if the symptoms worsen or if the condition fails to improve as anticipated.  Time:  I spent 12 minutes with the patient via telehealth technology discussing the above problems/concerns.    Leeanne Rio, PA-C

## 2021-03-07 ENCOUNTER — Ambulatory Visit: Payer: Federal, State, Local not specified - PPO | Admitting: Family Medicine

## 2021-04-08 ENCOUNTER — Ambulatory Visit: Payer: Federal, State, Local not specified - PPO | Admitting: Sports Medicine

## 2021-05-19 ENCOUNTER — Ambulatory Visit: Payer: Federal, State, Local not specified - PPO | Admitting: Family Medicine

## 2021-06-11 ENCOUNTER — Encounter: Payer: Self-pay | Admitting: Family Medicine

## 2021-06-11 ENCOUNTER — Ambulatory Visit: Payer: Federal, State, Local not specified - PPO | Admitting: Family Medicine

## 2021-06-11 ENCOUNTER — Other Ambulatory Visit: Payer: Self-pay

## 2021-06-11 VITALS — BP 130/70 | HR 51 | Temp 97.9°F | Ht 69.0 in | Wt 161.0 lb

## 2021-06-11 DIAGNOSIS — E291 Testicular hypofunction: Secondary | ICD-10-CM

## 2021-06-11 DIAGNOSIS — Z1211 Encounter for screening for malignant neoplasm of colon: Secondary | ICD-10-CM

## 2021-06-11 DIAGNOSIS — Z13220 Encounter for screening for lipoid disorders: Secondary | ICD-10-CM | POA: Diagnosis not present

## 2021-06-11 DIAGNOSIS — Z0000 Encounter for general adult medical examination without abnormal findings: Secondary | ICD-10-CM

## 2021-06-11 DIAGNOSIS — R5383 Other fatigue: Secondary | ICD-10-CM | POA: Diagnosis not present

## 2021-06-11 DIAGNOSIS — R7989 Other specified abnormal findings of blood chemistry: Secondary | ICD-10-CM

## 2021-06-11 NOTE — Progress Notes (Signed)
Terry Jenkins - 51 y.o. male MRN [id number]  Date of birth: 1971/03/05  Subjective Chief Complaint  Patient presents with   Establish Care    HPI Terry Jenkins is a 51 year old male here today for initial visit to establish care.  He has been in fairly good health.  He would like to have updated labs completed in anticipation of scheduling a physical in the near future.  In addition to routine lab work he would like thorough lab testing for hypogonadism as well as thyroid disorder.  He would like referral for colon cancer screening as well.   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[place])    Seasonal allergies    Varicocele     Past Surgical History:  Procedure Laterality Date   ANAL FISSURE REPAIR     HERNIA REPAIR      Social History   Socioeconomic History   Marital status: Single    Spouse name: Not on file   Number of children: Not on file   Years of education: Not on file   Highest education level: Not on file  Occupational History   Not on file  Tobacco Use   Smoking status: Former    Packs/day: 1.00    Years: 17.00    Pack years: 17.00    Types: Cigarettes    Quit date: 03/04/2004    Years since quitting: 17.2    Passive exposure: Never   Smokeless tobacco: Former    Types: Snuff    Quit date: 05/08/2020   Tobacco comments:    started snuff in 2019  Vaping Use   Vaping Use: Never used  Substance and Sexual Activity   Alcohol use: No   Drug use: Yes    Types: Marijuana    Comment: Edible   Sexual activity: Not Currently    Partners: Female  Other Topics Concern   Not on file  Social History Narrative   Not on file   Social Determinants of Health   Financial Resource Strain: Not on file  Food Insecurity: Not on file  Transportation Needs: Not on file   Physical Activity: Not on file  Stress: Not on file  Social Connections: Not on file    Family History  Problem Relation Age of Onset   Hypertension Mother    Hyperlipidemia Mother    Colon polyps Mother    Brain cancer Father    Healthy Sister    Colon cancer Maternal Grandmother    Prostate cancer Paternal Uncle    Cancer - Prostate Paternal Uncle    Cancer - Prostate Paternal Uncle     Health Maintenance  Topic Date Due   COLONOSCOPY (Pts 45-66yrs Insurance coverage will need to be confirmed)  Never done   COVID-19 Vaccine (2 - Booster for [employer] series) 06/27/2022 (Originally 10/05/2019)   Zoster Vaccines- Shingrix (1 of 2) 09/09/2022 (Originally 05/27/2021)   TETANUS/TDAP  08/12/2022   INFLUENZA VACCINE  Completed   Hepatitis C Screening  Completed   HIV Screening  Completed   HPV VACCINES  Aged Out     ----------------------------------------------------------------------------------------------------------------------------------------------------------------------------------------------------------------- Physical Exam BP 130/70 (BP Location: Left Arm, Patient Position: Sitting, Cuff Size: Normal)    Pulse (!) 51    Temp 97.9 F (36.6  C)    Ht 5\' 9"  (1.753 m)    Wt 161 lb (73 kg)    SpO2 100%    BMI 23.78 kg/m   Physical Exam Constitutional:      Appearance: Normal appearance.  HENT:     Head: Normocephalic and atraumatic.  Eyes:     General: No scleral icterus. Cardiovascular:     Rate and Rhythm: Normal rate and regular rhythm.  Pulmonary:     Effort: Pulmonary effort is normal.     Breath sounds: Normal breath sounds.  Musculoskeletal:     Cervical back: Neck supple.  Neurological:     General: No focal deficit present.     Mental Status: He is alert.  Psychiatric:        Mood and Affect: Mood normal.        Behavior: Behavior normal.     ------------------------------------------------------------------------------------------------------------------------------------------------------------------------------------------------------------------- Assessment and Plan  Hypogonadism in male History hypogonadism.  Updating labs today.  Will review at upcoming annual exam.  Other fatigue Orders Placed This Encounter  Procedures   COMPLETE METABOLIC PANEL WITH GFR   CBC with Differential   Lipid Panel w/reflex Direct LDL   TSH   T4, free   T3, free   Testosterone Total,Free,Bio, Males   Estradiol   FSH/LH   Iron, TIBC and Ferritin Panel   Ambulatory referral to Gastroenterology    Referral Priority:   Routine    Referral Type:   Consultation    Referral Reason:   Specialty Services Required    Number of Visits Requested:   1     No orders of the defined types were placed in this encounter.   No follow-ups on file.    This visit occurred during the SARS-CoV-2 public health emergency.  Safety protocols were in place, including screening questions prior to the visit, additional usage of staff PPE, and extensive cleaning of exam room while observing appropriate contact time as indicated for disinfecting solutions.

## 2021-06-11 NOTE — Assessment & Plan Note (Signed)
Orders Placed This Encounter  Procedures   COMPLETE METABOLIC PANEL WITH GFR   CBC with Differential   Lipid Panel w/reflex Direct LDL   TSH   T4, free   T3, free   Testosterone Total,Free,Bio, Males   Estradiol   FSH/LH   Iron, TIBC and Ferritin Panel   Ambulatory referral to Gastroenterology    Referral Priority:   Routine    Referral Type:   Consultation    Referral Reason:   Specialty Services Required    Number of Visits Requested:   1

## 2021-06-11 NOTE — Assessment & Plan Note (Signed)
History hypogonadism.  Updating labs today.  Will review at upcoming annual exam.

## 2021-06-12 LAB — IRON,TIBC AND FERRITIN PANEL
%SAT: 43 % (calc) (ref 20–48)
Ferritin: 74 ng/mL (ref 38–380)
Iron: 149 ug/dL (ref 50–180)
TIBC: 350 mcg/dL (calc) (ref 250–425)

## 2021-06-12 LAB — CBC WITH DIFFERENTIAL/PLATELET
Absolute Monocytes: 377 cells/uL (ref 200–950)
Basophils Absolute: 41 cells/uL (ref 0–200)
Basophils Relative: 0.8 %
Eosinophils Absolute: 71 cells/uL (ref 15–500)
Eosinophils Relative: 1.4 %
HCT: 44.8 % (ref 38.5–50.0)
Hemoglobin: 14.9 g/dL (ref 13.2–17.1)
Lymphs Abs: 974 cells/uL (ref 850–3900)
MCH: 30.4 pg (ref 27.0–33.0)
MCHC: 33.3 g/dL (ref 32.0–36.0)
MCV: 91.4 fL (ref 80.0–100.0)
MPV: 9.5 fL (ref 7.5–12.5)
Monocytes Relative: 7.4 %
Neutro Abs: 3636 cells/uL (ref 1500–7800)
Neutrophils Relative %: 71.3 %
Platelets: 246 10*3/uL (ref 140–400)
RBC: 4.9 10*6/uL (ref 4.20–5.80)
RDW: 12 % (ref 11.0–15.0)
Total Lymphocyte: 19.1 %
WBC: 5.1 10*3/uL (ref 3.8–10.8)

## 2021-06-12 LAB — COMPLETE METABOLIC PANEL WITH GFR
AG Ratio: 1.9 (calc) (ref 1.0–2.5)
ALT: 17 U/L (ref 9–46)
AST: 21 U/L (ref 10–35)
Albumin: 4.2 g/dL (ref 3.6–5.1)
Alkaline phosphatase (APISO): 50 U/L (ref 35–144)
BUN: 14 mg/dL (ref 7–25)
CO2: 29 mmol/L (ref 20–32)
Calcium: 9.4 mg/dL (ref 8.6–10.3)
Chloride: 104 mmol/L (ref 98–110)
Creat: 1.09 mg/dL (ref 0.70–1.30)
Globulin: 2.2 g/dL (calc) (ref 1.9–3.7)
Glucose, Bld: 81 mg/dL (ref 65–99)
Potassium: 4.3 mmol/L (ref 3.5–5.3)
Sodium: 139 mmol/L (ref 135–146)
Total Bilirubin: 0.8 mg/dL (ref 0.2–1.2)
Total Protein: 6.4 g/dL (ref 6.1–8.1)
eGFR: 83 mL/min/{1.73_m2} (ref >=60)

## 2021-06-12 LAB — LIPID PANEL W/REFLEX DIRECT LDL
Cholesterol: 237 mg/dL — ABNORMAL HIGH (ref <200)
HDL: 79 mg/dL (ref >=40)
LDL Cholesterol (Calc): 144 mg/dL (calc) — ABNORMAL HIGH
Non-HDL Cholesterol (Calc): 158 mg/dL (calc) — ABNORMAL HIGH (ref <130)
Total CHOL/HDL Ratio: 3 (calc) (ref <5.0)
Triglycerides: 50 mg/dL (ref <150)

## 2021-06-12 LAB — TESTOSTERONE TOTAL,FREE,BIO, MALES
Albumin: 4.2 g/dL (ref 3.6–5.1)
Sex Hormone Binding: 29 nmol/L (ref 10–50)
Testosterone, Bioavailable: 87.2 ng/dL — ABNORMAL LOW (ref 110.0–575.0)
Testosterone, Free: 45.2 pg/mL — ABNORMAL LOW (ref 46.0–224.0)
Testosterone: 314 ng/dL (ref 250–827)

## 2021-06-12 LAB — FSH/LH
FSH: 8.2 m[IU]/mL — ABNORMAL HIGH (ref 1.6–8.0)
LH: 3.1 m[IU]/mL (ref 1.5–9.3)

## 2021-06-12 LAB — T4, FREE: Free T4: 1.2 ng/dL (ref 0.8–1.8)

## 2021-06-12 LAB — ESTRADIOL: Estradiol: <15 pg/mL (ref <=39)

## 2021-06-12 LAB — T3, FREE: T3, Free: 3.7 pg/mL (ref 2.3–4.2)

## 2021-06-12 LAB — TSH: TSH: 2.41 mIU/L (ref 0.40–4.50)

## 2021-07-11 ENCOUNTER — Ambulatory Visit: Payer: Federal, State, Local not specified - PPO | Admitting: Sports Medicine

## 2021-07-11 ENCOUNTER — Ambulatory Visit (INDEPENDENT_AMBULATORY_CARE_PROVIDER_SITE_OTHER): Payer: Federal, State, Local not specified - PPO

## 2021-07-11 ENCOUNTER — Other Ambulatory Visit: Payer: Self-pay

## 2021-07-11 DIAGNOSIS — M24852 Other specific joint derangements of left hip, not elsewhere classified: Secondary | ICD-10-CM

## 2021-07-11 DIAGNOSIS — I878 Other specified disorders of veins: Secondary | ICD-10-CM | POA: Diagnosis not present

## 2021-07-11 DIAGNOSIS — M1612 Unilateral primary osteoarthritis, left hip: Secondary | ICD-10-CM | POA: Diagnosis not present

## 2021-07-11 NOTE — Assessment & Plan Note (Signed)
This is a very pleasant 51 year old male with history of hip osteoarthritis and a small labral tear noted on MR arthrography at an outside provider back in 2008, he is very active, he was doing a [place] split squat and felt a tearing/popping sensation left anterior hip/groin. ?Since then he has had some discomfort left anterior hip with some weakness to flexion of the left hip. ?On exam he has mild pain with internal rotation, weakness to resisted hip flexion, minimally positive FADIR and FABER signs. ?I do think he has an internal snapping hip/iliopsoas strain. ?We will get some baseline x-rays of the left hip, hip flexor conditioning, avoid squats until the end of next week, return to see me in 4 to 6 weeks. ?

## 2021-07-11 NOTE — Progress Notes (Signed)
? ? ?  Procedures performed today:   ? ?None. ? ?Independent interpretation of notes and tests performed by another provider:  ? ?Hip MR arthrography from 2008 reviewed, there is mild hip osteoarthritis and an anterior/superior labral tear. ? ?Brief History, Exam, Impression, and Recommendations:   ? ?Snapping hip, left ?This is a very pleasant 51 year old male with history of hip osteoarthritis and a small labral tear noted on MR arthrography at an outside provider back in 2008, he is very active, he was doing a [place] split squat and felt a tearing/popping sensation left anterior hip/groin. ?Since then he has had some discomfort left anterior hip with some weakness to flexion of the left hip. ?On exam he has mild pain with internal rotation, weakness to resisted hip flexion, minimally positive FADIR and FABER signs. ?I do think he has an internal snapping hip/iliopsoas strain. ?We will get some baseline x-rays of the left hip, hip flexor conditioning, avoid squats until the end of next week, return to see me in 4 to 6 weeks. ? ? ? ?___________________________________________ ?Gwen Her. Dianah Field, M.D., ABFM., CAQSM. ?Primary Care and Sports Medicine ?[place] ? ?Adjunct Instructor of Family Medicine  ?[employer] ?

## 2021-07-17 ENCOUNTER — Ambulatory Visit: Payer: Federal, State, Local not specified - PPO | Admitting: Family Medicine

## 2021-07-25 ENCOUNTER — Ambulatory Visit (INDEPENDENT_AMBULATORY_CARE_PROVIDER_SITE_OTHER): Payer: Federal, State, Local not specified - PPO | Admitting: Family Medicine

## 2021-07-25 ENCOUNTER — Encounter: Payer: Self-pay | Admitting: Family Medicine

## 2021-07-25 ENCOUNTER — Other Ambulatory Visit: Payer: Self-pay

## 2021-07-25 VITALS — BP 123/79 | HR 59 | Ht 69.0 in | Wt 157.0 lb

## 2021-07-25 DIAGNOSIS — Z0000 Encounter for general adult medical examination without abnormal findings: Secondary | ICD-10-CM

## 2021-07-25 DIAGNOSIS — Z125 Encounter for screening for malignant neoplasm of prostate: Secondary | ICD-10-CM | POA: Diagnosis not present

## 2021-07-25 DIAGNOSIS — Z1211 Encounter for screening for malignant neoplasm of colon: Secondary | ICD-10-CM

## 2021-07-25 DIAGNOSIS — I861 Scrotal varices: Secondary | ICD-10-CM | POA: Diagnosis not present

## 2021-07-25 NOTE — Patient Instructions (Signed)

## 2021-07-26 ENCOUNTER — Encounter: Payer: Self-pay | Admitting: Family Medicine

## 2021-07-26 DIAGNOSIS — R972 Elevated prostate specific antigen [PSA]: Secondary | ICD-10-CM

## 2021-07-26 LAB — PSA: PSA: 4.38 ng/mL — ABNORMAL HIGH (ref <=4.00)

## 2021-07-27 DIAGNOSIS — Z0000 Encounter for general adult medical examination without abnormal findings: Secondary | ICD-10-CM | POA: Insufficient documentation

## 2021-07-27 NOTE — Progress Notes (Signed)
?Terry Jenkins - 51 y.o. male MRN [id number]  Date of birth: [birth year] ? ?Subjective ?Chief Complaint  ?Patient presents with  ? Annual Exam  ? ? ?HPI ?Terry Jenkins is a 51 year old male here today for annual exam.  We reviewed labs that he had drawn his previous visit.  Cholesterol was elevated on recent labs.  His free testosterone levels were also slightly low however total testosterone levels look okay.  FSH levels were mildly elevated.  He may look into trying a supplement to help with this.  PSA levels were not drawn however he would like to have these checked. ? ?He does have a varicocele which he would like to have a urology referral for. ? ?He continues to stay very active.  He feels like his diet is pretty healthy. ? ?He is a former smoker.  He does not currently consume alcohol. ? ?ROS:  A comprehensive ROS was completed and negative except as noted per HPI ? ?Allergies  ?Allergen Reactions  ? Ampicillin Rash  ? ? ?Past Medical History:  ?Diagnosis Date  ? Anxiety   ? Hypercholesteremia   ? Marijuana smoker in remission [facility])   ? Seasonal allergies   ? Varicocele   ? ? ?Past Surgical History:  ?Procedure Laterality Date  ? ANAL FISSURE REPAIR    ? HERNIA REPAIR    ? ? ?Social History  ? ?Socioeconomic History  ? Marital status: Single  ?  Spouse name: Not on file  ? Number of children: Not on file  ? Years of education: Not on file  ? Highest education level: Not on file  ?Occupational History  ? Not on file  ?Tobacco Use  ? Smoking status: Former  ?  Packs/day: 1.00  ?  Years: 17.00  ?  Pack years: 17.00  ?  Types: Cigarettes  ?  Quit date: 03/04/2004  ?  Years since quitting: 17.4  ?  Passive exposure: Never  ? Smokeless tobacco: Former  ?  Types: Snuff  ?  Quit date: 05/08/2020  ? Tobacco comments:  ?  started snuff in 2019  ?Vaping Use  ? Vaping Use: Never used  ?Substance and Sexual Activity  ? Alcohol use: No  ? Drug use: Yes  ?  Types: Marijuana  ?  Comment: Edible  ? Sexual activity: Not Currently  ?   Partners: Female  ?Other Topics Concern  ? Not on file  ?Social History Narrative  ? Not on file  ? ?Social Determinants of Health  ? ?Financial Resource Strain: Not on file  ?Food Insecurity: Not on file  ?Transportation Needs: Not on file  ?Physical Activity: Not on file  ?Stress: Not on file  ?Social Connections: Not on file  ? ? ?Family History  ?Problem Relation Age of Onset  ? Hypertension Mother   ? Hyperlipidemia Mother   ? Colon polyps Mother   ? Brain cancer Father   ? Healthy Sister   ? Colon cancer Maternal Grandmother   ? Prostate cancer Paternal Uncle   ? Cancer - Prostate Paternal Uncle   ? Cancer - Prostate Paternal Uncle   ? ? ?Health Maintenance  ?Topic Date Due  ? COLONOSCOPY (Pts 45-51yr Insurance coverage will need to be confirmed)  Never done  ? COVID-19 Vaccine (2 - Booster for Janssen series) 06/27/2022 (Originally 10/05/2019)  ? Zoster Vaccines- Shingrix (1 of 2) 09/09/2022 (Originally 05/27/2021)  ? TETANUS/TDAP  08/12/2022  ? INFLUENZA VACCINE  Completed  ? Hepatitis C Screening  Completed  ? HIV Screening  Completed  ? HPV VACCINES  Aged Out  ? ? ? ?----------------------------------------------------------------------------------------------------------------------------------------------------------------------------------------------------------------- ?Physical Exam ?BP 123/79 (BP Location: Left Arm, Patient Position: Sitting, Cuff Size: Normal)   Pulse (!) 59   Ht '5\' 9"'$  (1.753 m)   Wt 157 lb (71.2 kg)   SpO2 100%   BMI 23.18 kg/m?  ? ?Physical Exam ?Constitutional:   ?   General: He is not in acute distress. ?   Appearance: Normal appearance.  ?HENT:  ?   Head: Normocephalic and atraumatic.  ?   Right Ear: Tympanic membrane and external ear normal.  ?   Left Ear: Tympanic membrane and external ear normal.  ?Eyes:  ?   General: No scleral icterus. ?Neck:  ?   Thyroid: No thyromegaly.  ?Cardiovascular:  ?   Rate and Rhythm: Normal rate and regular rhythm.  ?   Heart sounds: Normal  heart sounds.  ?Pulmonary:  ?   Effort: Pulmonary effort is normal.  ?   Breath sounds: Normal breath sounds.  ?Abdominal:  ?   General: Bowel sounds are normal. There is no distension.  ?   Palpations: Abdomen is soft.  ?   Tenderness: There is no abdominal tenderness. There is no guarding.  ?Musculoskeletal:  ?   Cervical back: Normal range of motion and neck supple.  ?Lymphadenopathy:  ?   Cervical: No cervical adenopathy.  ?Skin: ?   General: Skin is warm and dry.  ?   Findings: No rash.  ?Neurological:  ?   General: No focal deficit present.  ?   Mental Status: He is alert and oriented to person, place, and time.  ?   Cranial Nerves: No cranial nerve deficit.  ?   Motor: No abnormal muscle tone.  ?Psychiatric:     ?   Mood and Affect: Mood normal.     ?   Behavior: Behavior normal.  ? ? ?------------------------------------------------------------------------------------------------------------------------------------------------------------------------------------------------------------------- ?Assessment and Plan ? ?Well adult exam ?Well adult ?Orders Placed This Encounter  ?Procedures  ? PSA  ? Ambulatory referral to Gastroenterology  ?  Referral Priority:   Routine  ?  Referral Type:   Consultation  ?  Referral Reason:   Specialty Services Required  ?  Number of Visits Requested:   1  ? Ambulatory referral to Urology  ?  Referral Priority:   Routine  ?  Referral Type:   Consultation  ?  Referral Reason:   Specialty Services Required  ?  Requested Specialty:   Urology  ?  Number of Visits Requested:   1  ?Screenings: Referral for colonoscopy.  Recent labs reviewed with him.  Will order updated PSA. ?Immunizations: Shingles recommended, declines at this time. ?Anticipatory guidance/risk factor reduction: Recommendations per AVS. ? ? ?No orders of the defined types were placed in this encounter. ? ? ?No follow-ups on file. ? ? ? ?This visit occurred during the SARS-CoV-2 public health emergency.  Safety  protocols were in place, including screening questions prior to the visit, additional usage of staff PPE, and extensive cleaning of exam room while observing appropriate contact time as indicated for disinfecting solutions.  ? ?

## 2021-07-27 NOTE — Assessment & Plan Note (Signed)
Well adult ?Orders Placed This Encounter  ?Procedures  ?? PSA  ?? Ambulatory referral to Gastroenterology  ?  Referral Priority:   Routine  ?  Referral Type:   Consultation  ?  Referral Reason:   Specialty Services Required  ?  Number of Visits Requested:   1  ?? Ambulatory referral to Urology  ?  Referral Priority:   Routine  ?  Referral Type:   Consultation  ?  Referral Reason:   Specialty Services Required  ?  Requested Specialty:   Urology  ?  Number of Visits Requested:   1  ?Screenings: Referral for colonoscopy.  Recent labs reviewed with him.  Will order updated PSA. ?Immunizations: Shingles recommended, declines at this time. ?Anticipatory guidance/risk factor reduction: Recommendations per AVS. ?

## 2021-08-14 DIAGNOSIS — M1990 Unspecified osteoarthritis, unspecified site: Secondary | ICD-10-CM | POA: Insufficient documentation

## 2021-08-15 ENCOUNTER — Ambulatory Visit (INDEPENDENT_AMBULATORY_CARE_PROVIDER_SITE_OTHER): Payer: Federal, State, Local not specified - PPO

## 2021-08-15 ENCOUNTER — Ambulatory Visit: Payer: Federal, State, Local not specified - PPO | Admitting: Sports Medicine

## 2021-08-15 DIAGNOSIS — M549 Dorsalgia, unspecified: Secondary | ICD-10-CM | POA: Insufficient documentation

## 2021-08-15 DIAGNOSIS — M545 Low back pain, unspecified: Secondary | ICD-10-CM | POA: Diagnosis not present

## 2021-08-15 DIAGNOSIS — M546 Pain in thoracic spine: Secondary | ICD-10-CM

## 2021-08-15 DIAGNOSIS — M47814 Spondylosis without myelopathy or radiculopathy, thoracic region: Secondary | ICD-10-CM | POA: Diagnosis not present

## 2021-08-15 DIAGNOSIS — W19XXXA Unspecified fall, initial encounter: Secondary | ICD-10-CM

## 2021-08-15 DIAGNOSIS — R0781 Pleurodynia: Secondary | ICD-10-CM

## 2021-08-15 DIAGNOSIS — R972 Elevated prostate specific antigen [PSA]: Secondary | ICD-10-CM | POA: Diagnosis not present

## 2021-08-15 DIAGNOSIS — M24852 Other specific joint derangements of left hip, not elsewhere classified: Secondary | ICD-10-CM

## 2021-08-15 NOTE — Assessment & Plan Note (Signed)
Also was doing some exercises and had a fall, now having some pain right sided quadratus lumborum without radiation. ?Fall was 3 weeks ago, we will go ahead and get right rib series x-rays, thoracic and lumbar spine x-rays, he does have an old thoracic compression fracture from snowboarding in the past. ?Adding some home conditioning, follow-up will depend on x-ray results. ?Denies any hematuria, loin to groin pain, fevers, chills. ?

## 2021-08-15 NOTE — Progress Notes (Signed)
? ? ?  Procedures performed today:   ? ?None. ? ?Independent interpretation of notes and tests performed by another provider:  ? ?None. ? ?Brief History, Exam, Impression, and Recommendations:   ? 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 ?He responded well to conservative treatment and symptoms have resolved. ?Of note he does have osteoarthritis on x-rays and had a small labral tear noted on MR arthrography from an outside provider back in 2008. ?Activities as tolerated for this. ? ?Acute back pain ?Also was doing some exercises and had a fall, now having some pain right sided quadratus lumborum without radiation. ?Fall was 3 weeks ago, we will go ahead and get right rib series x-rays, thoracic and lumbar spine x-rays, he does have an old thoracic compression fracture from snowboarding in the past. ?Adding some home conditioning, follow-up will depend on x-ray results. ?Denies any hematuria, loin to groin pain, fevers, chills. ? ? ? ?___________________________________________ ?Gwen Her. Dianah Field, M.D., ABFM., CAQSM. ?Primary Care and Sports Medicine ?[place] ? ?Adjunct Instructor of Family Medicine  ?[employer] ?

## 2021-08-15 NOTE — Assessment & Plan Note (Addendum)
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 ?He responded well to conservative treatment and symptoms have resolved. ?Of note he does have osteoarthritis on x-rays and had a small labral tear noted on MR arthrography from an outside provider back in 2008. ?Activities as tolerated for this. ?

## 2021-08-18 ENCOUNTER — Other Ambulatory Visit: Payer: Self-pay

## 2021-08-18 LAB — PSA, TOTAL AND FREE
PSA, % Free: 9 % (calc) — ABNORMAL LOW (ref >25)
PSA, Free: 0.5 ng/mL
PSA, Total: 5.8 ng/mL — ABNORMAL HIGH (ref <=4.0)

## 2021-08-19 ENCOUNTER — Encounter: Payer: Self-pay | Admitting: Family Medicine

## 2021-08-19 ENCOUNTER — Ambulatory Visit: Payer: Federal, State, Local not specified - PPO | Admitting: Family Medicine

## 2021-08-19 VITALS — BP 135/78 | HR 62 | Ht 69.0 in | Wt 157.0 lb

## 2021-08-19 DIAGNOSIS — R350 Frequency of micturition: Secondary | ICD-10-CM | POA: Diagnosis not present

## 2021-08-19 DIAGNOSIS — R1031 Right lower quadrant pain: Secondary | ICD-10-CM | POA: Diagnosis not present

## 2021-08-19 DIAGNOSIS — R109 Unspecified abdominal pain: Secondary | ICD-10-CM | POA: Diagnosis not present

## 2021-08-19 DIAGNOSIS — R972 Elevated prostate specific antigen [PSA]: Secondary | ICD-10-CM | POA: Diagnosis not present

## 2021-08-19 MED ORDER — TAMSULOSIN HCL 0.4 MG PO CAPS: 0.4000 mg | ORAL_CAPSULE | Freq: Every day | ORAL | 3 refills | Status: DC

## 2021-08-19 NOTE — Progress Notes (Signed)
?Terry Jenkins - 51 y.o. male MRN [id number]  Date of birth: [birth year] ? ?Subjective ?Chief Complaint  ?Patient presents with  ? Results  ? ? ?HPI ?Terry Jenkins is a 51 y.o. male here today for follow up of elevaed PSA.  Recent PSA elevated at 4.38, repeat at 5.8% with free PSA of 9.  He has had some urinary frequency.  Denies pain with urination or difficulty starting stream.  No groin, rectal or perineal pain to suggest prostatitis.  There is a strong family history of prostate cancer with grandfather and two uncles having prostate cancer.  ? ?ROS:  A comprehensive ROS was completed and negative except as noted per HPI ? ?Allergies  ?Allergen Reactions  ? Ampicillin Rash  ? ? ?Past Medical History:  ?Diagnosis Date  ? Anxiety   ? Hypercholesteremia   ? Marijuana smoker in remission [facility])   ? Seasonal allergies   ? Varicocele   ? ? ?Past Surgical History:  ?Procedure Laterality Date  ? ANAL FISSURE REPAIR    ? HERNIA REPAIR    ? ? ?Social History  ? ?Socioeconomic History  ? Marital status: Single  ?  Spouse name: Not on file  ? Number of children: Not on file  ? Years of education: Not on file  ? Highest education level: Not on file  ?Occupational History  ? Not on file  ?Tobacco Use  ? Smoking status: Former  ?  Packs/day: 1.00  ?  Years: 17.00  ?  Pack years: 17.00  ?  Types: Cigarettes  ?  Quit date: 03/04/2004  ?  Years since quitting: 17.4  ?  Passive exposure: Never  ? Smokeless tobacco: Former  ?  Types: Snuff  ?  Quit date: 05/08/2020  ? Tobacco comments:  ?  started snuff in 2019  ?Vaping Use  ? Vaping Use: Never used  ?Substance and Sexual Activity  ? Alcohol use: No  ? Drug use: Yes  ?  Types: Marijuana  ?  Comment: Edible  ? Sexual activity: Not Currently  ?  Partners: Female  ?Other Topics Concern  ? Not on file  ?Social History Narrative  ? Not on file  ? ?Social Determinants of Health  ? ?Financial Resource Strain: Not on file  ?Food Insecurity: Not on file  ?Transportation Needs: Not on file  ?Physical  Activity: Not on file  ?Stress: Not on file  ?Social Connections: Not on file  ? ? ?Family History  ?Problem Relation Age of Onset  ? Hypertension Mother   ? Hyperlipidemia Mother   ? Colon polyps Mother   ? Brain cancer Father   ? Healthy Sister   ? Colon cancer Maternal Grandmother   ? Prostate cancer Paternal Uncle   ? Cancer - Prostate Paternal Uncle   ? Cancer - Prostate Paternal Uncle   ? ? ?Health Maintenance  ?Topic Date Due  ? COLONOSCOPY (Pts 45-21yr Insurance coverage will need to be confirmed)  Never done  ? COVID-19 Vaccine (2 - Booster for Janssen series) 06/27/2022 (Originally 10/05/2019)  ? Zoster Vaccines- Shingrix (1 of 2) 09/09/2022 (Originally 05/27/2021)  ? INFLUENZA VACCINE  12/02/2021  ? TETANUS/TDAP  08/12/2022  ? Hepatitis C Screening  Completed  ? HIV Screening  Completed  ? HPV VACCINES  Aged Out  ? ? ? ?----------------------------------------------------------------------------------------------------------------------------------------------------------------------------------------------------------------- ?Physical Exam ?BP 135/78 (BP Location: Left Arm, Patient Position: Sitting, Cuff Size: Normal)   Pulse 62   Ht '5\' 9"'$  (1.753 m)  Wt 157 lb (71.2 kg)   SpO2 100%   BMI 23.18 kg/m?  ? ?Physical Exam ?Constitutional:   ?   Appearance: Normal appearance.  ?Eyes:  ?   General: No scleral icterus. ?Cardiovascular:  ?   Rate and Rhythm: Normal rate and regular rhythm.  ?Pulmonary:  ?   Effort: Pulmonary effort is normal.  ?   Breath sounds: Normal breath sounds.  ?Musculoskeletal:  ?   Cervical back: Neck supple.  ?Neurological:  ?   General: No focal deficit present.  ?   Mental Status: He is alert.  ?Psychiatric:     ?   Mood and Affect: Mood normal.     ?   Behavior: Behavior normal.   ? ? ?------------------------------------------------------------------------------------------------------------------------------------------------------------------------------------------------------------------- ?Assessment and Plan ? ?PSA elevation ?Elevated PSA x2.  He does have some urinary frequency.  Will try addition of flomax to help with this  Referral placed to urology and checking UA today.   ? ? ?Meds ordered this encounter  ?Medications  ? tamsulosin (FLOMAX) 0.4 MG CAPS capsule  ?  Sig: Take 1 capsule (0.4 mg total) by mouth daily.  ?  Dispense:  30 capsule  ?  Refill:  3  ? ? ?No follow-ups on file. ? ? ? ?This visit occurred during the SARS-CoV-2 public health emergency.  Safety protocols were in place, including screening questions prior to the visit, additional usage of staff PPE, and extensive cleaning of exam room while observing appropriate contact time as indicated for disinfecting solutions.  ? ?

## 2021-08-19 NOTE — Assessment & Plan Note (Signed)
Elevated PSA x2.  He does have some urinary frequency.  Will try addition of flomax to help with this  Referral placed to urology and checking UA today.   ?

## 2021-08-19 NOTE — Patient Instructions (Signed)
Let's try adding flomax for the urinary frequency.  ?You should be contacted by urology for appointment.  ?

## 2021-08-20 ENCOUNTER — Encounter: Payer: Self-pay | Admitting: Family Medicine

## 2021-08-20 LAB — URINALYSIS, ROUTINE W REFLEX MICROSCOPIC
Bilirubin Urine: NEGATIVE
Glucose, UA: NEGATIVE
Hgb urine dipstick: NEGATIVE
Ketones, ur: NEGATIVE
Leukocytes,Ua: NEGATIVE
Nitrite: NEGATIVE
Protein, ur: NEGATIVE
Specific Gravity, Urine: 1.004 (ref 1.001–1.035)
pH: 7.5 (ref 5.0–8.0)

## 2021-08-20 LAB — URINE CULTURE
MICRO NUMBER:: 13280325
Result:: NO GROWTH
SPECIMEN QUALITY:: ADEQUATE

## 2021-09-11 DIAGNOSIS — R972 Elevated prostate specific antigen [PSA]: Secondary | ICD-10-CM | POA: Diagnosis not present

## 2021-09-11 DIAGNOSIS — E291 Testicular hypofunction: Secondary | ICD-10-CM | POA: Diagnosis not present

## 2021-09-11 DIAGNOSIS — E349 Endocrine disorder, unspecified: Secondary | ICD-10-CM | POA: Diagnosis not present

## 2021-09-18 ENCOUNTER — Encounter: Payer: Self-pay | Admitting: Family Medicine

## 2021-09-18 ENCOUNTER — Ambulatory Visit: Payer: Federal, State, Local not specified - PPO | Admitting: Family Medicine

## 2021-09-18 DIAGNOSIS — R972 Elevated prostate specific antigen [PSA]: Secondary | ICD-10-CM

## 2021-09-18 NOTE — Assessment & Plan Note (Signed)
Plan to undergo biopsy of prostate.  Management per urology at this time.

## 2021-09-18 NOTE — Progress Notes (Signed)
Terry Jenkins - 51 y.o. male MRN [id number]  Date of birth: 04-09-1971  Subjective Chief Complaint  Patient presents with   Follow-up    HPI Terry Jenkins is a 51 year old male here today to discuss recent urology visit.  Reports he had PSA repeated and this remains elevated.  Plan is to do biopsy As he has a strong family history of prostate cancer.  He is concerned about hypergonadism as well and urologist discussed with him that they would address this after prostate issue is taken care of.  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[place])    Seasonal allergies    Varicocele     Past Surgical History:  Procedure Laterality Date   ANAL FISSURE REPAIR     HERNIA REPAIR      Social History   Socioeconomic History   Marital status: Single    Spouse name: Not on file   Number of children: Not on file   Years of education: Not on file   Highest education level: Not on file  Occupational History   Not on file  Tobacco Use   Smoking status: Former    Packs/day: 1.00    Years: 17.00    Pack years: 17.00    Types: Cigarettes    Quit date: 03/04/2004    Years since quitting: 17.5    Passive exposure: Never   Smokeless tobacco: Former    Types: Snuff    Quit date: 05/08/2020   Tobacco comments:    started snuff in 2019  Vaping Use   Vaping Use: Never used  Substance and Sexual Activity   Alcohol use: No   Drug use: Yes    Types: Marijuana    Comment: Edible   Sexual activity: Not Currently    Partners: Female  Other Topics Concern   Not on file  Social History Narrative   Not on file   Social Determinants of Health   Financial Resource Strain: Not on file  Food Insecurity: Not on file  Transportation Needs: Not on file  Physical Activity: Not on file  Stress: Not on file   Social Connections: Not on file    Family History  Problem Relation Age of Onset   Hypertension Mother    Hyperlipidemia Mother    Colon polyps Mother    Brain cancer Father    Healthy Sister    Colon cancer Maternal Grandmother    Prostate cancer Paternal Uncle    Cancer - Prostate Paternal Uncle    Cancer - Prostate Paternal Uncle     Health Maintenance  Topic Date Due   COVID-19 Vaccine (2 - Booster for [employer] series) 06/27/2022 (Originally 10/05/2019)   Zoster Vaccines- Shingrix (1 of 2) 09/09/2022 (Originally 05/27/2021)   COLONOSCOPY (Pts 45-76yr Insurance coverage will need to be confirmed)  09/19/2022 (Originally 05/27/2016)   INFLUENZA VACCINE  12/02/2021   TETANUS/TDAP  08/12/2022   Hepatitis C Screening  Completed   HIV Screening  Completed   HPV VACCINES  Aged Out     ----------------------------------------------------------------------------------------------------------------------------------------------------------------------------------------------------------------- Physical Exam BP 129/78 (BP Location: Left Arm, Patient Position: Sitting, Cuff Size: Normal)   Pulse 88   Ht '5\' 9"'$  (1.753 m)   Wt 155 lb (70.3 kg)   SpO2 100%  BMI 22.89 kg/m   Physical Exam Constitutional:      Appearance: Normal appearance.  Neurological:     Mental Status: He is alert.  Psychiatric:        Mood and Affect: Mood normal.        Behavior: Behavior normal.    ------------------------------------------------------------------------------------------------------------------------------------------------------------------------------------------------------------------- Assessment and Plan  PSA elevation Plan to undergo biopsy of prostate.  Management per urology at this time.   No orders of the defined types were placed in this encounter.   No follow-ups on file.    This visit occurred during the SARS-CoV-2 public health emergency.  Safety protocols were in  place, including screening questions prior to the visit, additional usage of staff PPE, and extensive cleaning of exam room while observing appropriate contact time as indicated for disinfecting solutions.

## 2021-09-18 NOTE — Addendum Note (Signed)
Addended by: Perlie Mayo on: 09/18/2021 09:58 PM   Modules accepted: Level of Service

## 2021-09-25 ENCOUNTER — Ambulatory Visit: Payer: Federal, State, Local not specified - PPO | Admitting: Sports Medicine

## 2021-09-25 DIAGNOSIS — M546 Pain in thoracic spine: Secondary | ICD-10-CM

## 2021-09-25 MED ORDER — PREDNISONE 50 MG PO TABS: ORAL_TABLET | ORAL | 0 refills | Status: DC

## 2021-09-25 NOTE — Assessment & Plan Note (Signed)
Pleasant 51 year old male, I saw him about 6 weeks ago with acute back pain after a fall. We got some x-rays of his rib cage, thoracic, lumbar spine, he had some thoracic and lumbar degenerative processes, no rib fractures. He did improve. In the interim he has been working with his PCP regarding an elevated PSA with a very low free PSA concerning for prostate cancer, he does have a biopsy coming up. Pain is worse with flexion, leaning forward. I did explain to him that I would like to do a CT scan of his chest to include his thoracic spine and ribs, as well as potentially an MRI of his cervical spine as the majority of his pain today is right periscapular. He would like to hold off on advanced imaging for now, we will do 5 days of prednisone, home conditioning, with a follow-up in 4 to 6 weeks, and he understands that follow-up is crucial here with his prostate history, and that if he was not doing significantly better we would absolutely need advanced imaging of the spine.

## 2021-09-25 NOTE — Progress Notes (Signed)
    Procedures performed today:    None.  Independent interpretation of notes and tests performed by another provider:   None.  Brief History, Exam, Impression, and Recommendations:    Acute back pain Pleasant 51 year old male, I saw him about 6 weeks ago with acute back pain after a fall. We got some x-rays of his rib cage, thoracic, lumbar spine, he had some thoracic and lumbar degenerative processes, no rib fractures. He did improve. In the interim he has been working with his PCP regarding an elevated PSA with a very low free PSA concerning for prostate cancer, he does have a biopsy coming up. Pain is worse with flexion, leaning forward. I did explain to him that I would like to do a CT scan of his chest to include his thoracic spine and ribs, as well as potentially an MRI of his cervical spine as the majority of his pain today is right periscapular. He would like to hold off on advanced imaging for now, we will do 5 days of prednisone, home conditioning, with a follow-up in 4 to 6 weeks, and he understands that follow-up is crucial here with his prostate history, and that if he was not doing significantly better we would absolutely need advanced imaging of the spine.  Chronic process with exacerbation and pharmacologic intervention  ___________________________________________ Gwen Her. Dianah Field, M.D., ABFM., CAQSM. Primary Care and [place] Instructor of [place] of [facility] of Medicine

## 2021-10-06 DIAGNOSIS — C61 Malignant neoplasm of prostate: Secondary | ICD-10-CM | POA: Diagnosis not present

## 2021-10-06 DIAGNOSIS — R972 Elevated prostate specific antigen [PSA]: Secondary | ICD-10-CM | POA: Diagnosis not present

## 2021-10-14 DIAGNOSIS — C61 Malignant neoplasm of prostate: Secondary | ICD-10-CM | POA: Insufficient documentation

## 2021-10-17 DIAGNOSIS — C61 Malignant neoplasm of prostate: Secondary | ICD-10-CM | POA: Diagnosis not present

## 2021-10-17 DIAGNOSIS — R972 Elevated prostate specific antigen [PSA]: Secondary | ICD-10-CM | POA: Diagnosis not present

## 2021-10-22 DIAGNOSIS — M47812 Spondylosis without myelopathy or radiculopathy, cervical region: Secondary | ICD-10-CM | POA: Diagnosis not present

## 2021-10-22 DIAGNOSIS — M7542 Impingement syndrome of left shoulder: Secondary | ICD-10-CM | POA: Diagnosis not present

## 2021-10-22 DIAGNOSIS — M9901 Segmental and somatic dysfunction of cervical region: Secondary | ICD-10-CM | POA: Diagnosis not present

## 2021-10-22 DIAGNOSIS — M9903 Segmental and somatic dysfunction of lumbar region: Secondary | ICD-10-CM | POA: Diagnosis not present

## 2021-10-24 DIAGNOSIS — Z1211 Encounter for screening for malignant neoplasm of colon: Secondary | ICD-10-CM | POA: Diagnosis not present

## 2021-10-24 DIAGNOSIS — D122 Benign neoplasm of ascending colon: Secondary | ICD-10-CM | POA: Diagnosis not present

## 2021-10-24 DIAGNOSIS — D120 Benign neoplasm of cecum: Secondary | ICD-10-CM | POA: Diagnosis not present

## 2021-10-24 LAB — HM COLONOSCOPY

## 2021-10-31 ENCOUNTER — Encounter: Payer: Self-pay | Admitting: Sports Medicine

## 2021-10-31 ENCOUNTER — Ambulatory Visit: Payer: Federal, State, Local not specified - PPO | Admitting: Sports Medicine

## 2021-10-31 DIAGNOSIS — M546 Pain in thoracic spine: Secondary | ICD-10-CM | POA: Diagnosis not present

## 2021-10-31 MED ORDER — MELOXICAM 15 MG PO TABS: ORAL_TABLET | ORAL | 3 refills | Status: DC

## 2021-10-31 NOTE — Assessment & Plan Note (Signed)
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 Currently followed by urology. X-rays were negative for any evidence of metastatic disease, he did have significant widespread degenerative changes as expected. Prednisone knocked out all discomfort, adding some meloxicam for use as needed Return to see me as needed.

## 2021-10-31 NOTE — Progress Notes (Signed)
    Procedures performed today:    None.  Independent interpretation of notes and tests performed by another provider:   None.  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 Currently followed by urology. X-rays were negative for any evidence of metastatic disease, he did have significant widespread degenerative changes as expected. Prednisone knocked out all discomfort, adding some meloxicam for use as needed Return to see me as needed.    ____________________________________________ Gwen Her. Dianah Field, M.D., ABFM., CAQSM., AME. Primary Care and Sports Medicine [facility]  Adjunct Professor of [place], [place] [employer] [facility] of Medicine  Risk manager

## 2021-11-21 DIAGNOSIS — M47812 Spondylosis without myelopathy or radiculopathy, cervical region: Secondary | ICD-10-CM | POA: Diagnosis not present

## 2021-11-21 DIAGNOSIS — M9903 Segmental and somatic dysfunction of lumbar region: Secondary | ICD-10-CM | POA: Diagnosis not present

## 2021-11-21 DIAGNOSIS — M9901 Segmental and somatic dysfunction of cervical region: Secondary | ICD-10-CM | POA: Diagnosis not present

## 2021-11-21 DIAGNOSIS — M7542 Impingement syndrome of left shoulder: Secondary | ICD-10-CM | POA: Diagnosis not present

## 2021-12-15 DIAGNOSIS — L820 Inflamed seborrheic keratosis: Secondary | ICD-10-CM | POA: Diagnosis not present

## 2021-12-15 DIAGNOSIS — D1801 Hemangioma of skin and subcutaneous tissue: Secondary | ICD-10-CM | POA: Diagnosis not present

## 2021-12-15 DIAGNOSIS — L821 Other seborrheic keratosis: Secondary | ICD-10-CM | POA: Diagnosis not present

## 2021-12-15 DIAGNOSIS — L578 Other skin changes due to chronic exposure to nonionizing radiation: Secondary | ICD-10-CM | POA: Diagnosis not present

## 2021-12-15 DIAGNOSIS — L814 Other melanin hyperpigmentation: Secondary | ICD-10-CM | POA: Diagnosis not present

## 2021-12-19 DIAGNOSIS — M9904 Segmental and somatic dysfunction of sacral region: Secondary | ICD-10-CM | POA: Diagnosis not present

## 2021-12-19 DIAGNOSIS — M9903 Segmental and somatic dysfunction of lumbar region: Secondary | ICD-10-CM | POA: Diagnosis not present

## 2021-12-19 DIAGNOSIS — M545 Low back pain, unspecified: Secondary | ICD-10-CM | POA: Diagnosis not present

## 2021-12-19 DIAGNOSIS — M9906 Segmental and somatic dysfunction of lower extremity: Secondary | ICD-10-CM | POA: Diagnosis not present

## 2021-12-19 DIAGNOSIS — M9905 Segmental and somatic dysfunction of pelvic region: Secondary | ICD-10-CM | POA: Diagnosis not present

## 2021-12-19 DIAGNOSIS — M7918 Myalgia, other site: Secondary | ICD-10-CM | POA: Diagnosis not present

## 2021-12-19 DIAGNOSIS — M9901 Segmental and somatic dysfunction of cervical region: Secondary | ICD-10-CM | POA: Diagnosis not present

## 2021-12-19 DIAGNOSIS — M9902 Segmental and somatic dysfunction of thoracic region: Secondary | ICD-10-CM | POA: Diagnosis not present

## 2021-12-19 DIAGNOSIS — M7542 Impingement syndrome of left shoulder: Secondary | ICD-10-CM | POA: Diagnosis not present

## 2022-01-02 DIAGNOSIS — M545 Low back pain, unspecified: Secondary | ICD-10-CM | POA: Diagnosis not present

## 2022-01-02 DIAGNOSIS — M7542 Impingement syndrome of left shoulder: Secondary | ICD-10-CM | POA: Diagnosis not present

## 2022-01-02 DIAGNOSIS — M9905 Segmental and somatic dysfunction of pelvic region: Secondary | ICD-10-CM | POA: Diagnosis not present

## 2022-01-02 DIAGNOSIS — M9902 Segmental and somatic dysfunction of thoracic region: Secondary | ICD-10-CM | POA: Diagnosis not present

## 2022-01-02 DIAGNOSIS — M7918 Myalgia, other site: Secondary | ICD-10-CM | POA: Diagnosis not present

## 2022-01-02 DIAGNOSIS — M9904 Segmental and somatic dysfunction of sacral region: Secondary | ICD-10-CM | POA: Diagnosis not present

## 2022-01-02 DIAGNOSIS — M9903 Segmental and somatic dysfunction of lumbar region: Secondary | ICD-10-CM | POA: Diagnosis not present

## 2022-01-02 DIAGNOSIS — M9901 Segmental and somatic dysfunction of cervical region: Secondary | ICD-10-CM | POA: Diagnosis not present

## 2022-01-02 DIAGNOSIS — M9906 Segmental and somatic dysfunction of lower extremity: Secondary | ICD-10-CM | POA: Diagnosis not present

## 2022-01-02 DIAGNOSIS — M47812 Spondylosis without myelopathy or radiculopathy, cervical region: Secondary | ICD-10-CM | POA: Diagnosis not present

## 2022-01-30 DIAGNOSIS — M9901 Segmental and somatic dysfunction of cervical region: Secondary | ICD-10-CM | POA: Diagnosis not present

## 2022-01-30 DIAGNOSIS — M9903 Segmental and somatic dysfunction of lumbar region: Secondary | ICD-10-CM | POA: Diagnosis not present

## 2022-01-30 DIAGNOSIS — M47812 Spondylosis without myelopathy or radiculopathy, cervical region: Secondary | ICD-10-CM | POA: Diagnosis not present

## 2022-01-30 DIAGNOSIS — M7542 Impingement syndrome of left shoulder: Secondary | ICD-10-CM | POA: Diagnosis not present

## 2022-02-13 DIAGNOSIS — M9903 Segmental and somatic dysfunction of lumbar region: Secondary | ICD-10-CM | POA: Diagnosis not present

## 2022-02-13 DIAGNOSIS — M47812 Spondylosis without myelopathy or radiculopathy, cervical region: Secondary | ICD-10-CM | POA: Diagnosis not present

## 2022-02-13 DIAGNOSIS — M7542 Impingement syndrome of left shoulder: Secondary | ICD-10-CM | POA: Diagnosis not present

## 2022-02-13 DIAGNOSIS — M9901 Segmental and somatic dysfunction of cervical region: Secondary | ICD-10-CM | POA: Diagnosis not present

## 2022-02-26 ENCOUNTER — Other Ambulatory Visit: Payer: Self-pay | Admitting: Urology

## 2022-02-26 DIAGNOSIS — C61 Malignant neoplasm of prostate: Secondary | ICD-10-CM

## 2022-03-06 DIAGNOSIS — M7542 Impingement syndrome of left shoulder: Secondary | ICD-10-CM | POA: Diagnosis not present

## 2022-03-06 DIAGNOSIS — M9901 Segmental and somatic dysfunction of cervical region: Secondary | ICD-10-CM | POA: Diagnosis not present

## 2022-03-06 DIAGNOSIS — M9903 Segmental and somatic dysfunction of lumbar region: Secondary | ICD-10-CM | POA: Diagnosis not present

## 2022-03-06 DIAGNOSIS — M9902 Segmental and somatic dysfunction of thoracic region: Secondary | ICD-10-CM | POA: Diagnosis not present

## 2022-03-06 DIAGNOSIS — M9904 Segmental and somatic dysfunction of sacral region: Secondary | ICD-10-CM | POA: Diagnosis not present

## 2022-03-06 DIAGNOSIS — M9905 Segmental and somatic dysfunction of pelvic region: Secondary | ICD-10-CM | POA: Diagnosis not present

## 2022-03-06 DIAGNOSIS — M545 Low back pain, unspecified: Secondary | ICD-10-CM | POA: Diagnosis not present

## 2022-03-06 DIAGNOSIS — M9906 Segmental and somatic dysfunction of lower extremity: Secondary | ICD-10-CM | POA: Diagnosis not present

## 2022-03-06 DIAGNOSIS — M7918 Myalgia, other site: Secondary | ICD-10-CM | POA: Diagnosis not present

## 2022-03-31 ENCOUNTER — Ambulatory Visit
Admission: RE | Admit: 2022-03-31 | Discharge: 2022-03-31 | Disposition: A | Discharge status: Home / Self Care | Payer: Federal, State, Local not specified - PPO | Source: Ambulatory Visit | Attending: Urology | Admitting: Urology

## 2022-03-31 DIAGNOSIS — C61 Malignant neoplasm of prostate: Secondary | ICD-10-CM

## 2022-03-31 MED ORDER — GADOPICLENOL 0.5 MMOL/ML IV SOLN
7.5000 mL | Freq: Once | INTRAVENOUS | Status: AC | PRN
  Administered 2022-03-31: 7.5 mL via INTRAVENOUS

## 2022-04-03 DIAGNOSIS — C61 Malignant neoplasm of prostate: Secondary | ICD-10-CM | POA: Diagnosis not present

## 2022-04-10 DIAGNOSIS — R972 Elevated prostate specific antigen [PSA]: Secondary | ICD-10-CM | POA: Diagnosis not present

## 2022-04-10 DIAGNOSIS — C61 Malignant neoplasm of prostate: Secondary | ICD-10-CM | POA: Diagnosis not present

## 2022-04-17 DIAGNOSIS — M47812 Spondylosis without myelopathy or radiculopathy, cervical region: Secondary | ICD-10-CM | POA: Diagnosis not present

## 2022-04-17 DIAGNOSIS — M9903 Segmental and somatic dysfunction of lumbar region: Secondary | ICD-10-CM | POA: Diagnosis not present

## 2022-04-17 DIAGNOSIS — M9901 Segmental and somatic dysfunction of cervical region: Secondary | ICD-10-CM | POA: Diagnosis not present

## 2022-04-17 DIAGNOSIS — M7542 Impingement syndrome of left shoulder: Secondary | ICD-10-CM | POA: Diagnosis not present

## 2022-05-15 DIAGNOSIS — M7918 Myalgia, other site: Secondary | ICD-10-CM | POA: Diagnosis not present

## 2022-05-15 DIAGNOSIS — M9906 Segmental and somatic dysfunction of lower extremity: Secondary | ICD-10-CM | POA: Diagnosis not present

## 2022-05-15 DIAGNOSIS — M9901 Segmental and somatic dysfunction of cervical region: Secondary | ICD-10-CM | POA: Diagnosis not present

## 2022-05-15 DIAGNOSIS — M7542 Impingement syndrome of left shoulder: Secondary | ICD-10-CM | POA: Diagnosis not present

## 2022-05-15 DIAGNOSIS — M9904 Segmental and somatic dysfunction of sacral region: Secondary | ICD-10-CM | POA: Diagnosis not present

## 2022-05-15 DIAGNOSIS — M9905 Segmental and somatic dysfunction of pelvic region: Secondary | ICD-10-CM | POA: Diagnosis not present

## 2022-05-15 DIAGNOSIS — M545 Low back pain, unspecified: Secondary | ICD-10-CM | POA: Diagnosis not present

## 2022-05-15 DIAGNOSIS — M9903 Segmental and somatic dysfunction of lumbar region: Secondary | ICD-10-CM | POA: Diagnosis not present

## 2022-05-15 DIAGNOSIS — M9902 Segmental and somatic dysfunction of thoracic region: Secondary | ICD-10-CM | POA: Diagnosis not present

## 2022-05-22 DIAGNOSIS — C61 Malignant neoplasm of prostate: Secondary | ICD-10-CM | POA: Diagnosis not present

## 2022-05-29 DIAGNOSIS — R972 Elevated prostate specific antigen [PSA]: Secondary | ICD-10-CM | POA: Diagnosis not present

## 2022-05-29 DIAGNOSIS — C61 Malignant neoplasm of prostate: Secondary | ICD-10-CM | POA: Diagnosis not present

## 2022-05-29 DIAGNOSIS — R8271 Bacteriuria: Secondary | ICD-10-CM | POA: Diagnosis not present

## 2022-06-05 DIAGNOSIS — M9901 Segmental and somatic dysfunction of cervical region: Secondary | ICD-10-CM | POA: Diagnosis not present

## 2022-06-05 DIAGNOSIS — M7918 Myalgia, other site: Secondary | ICD-10-CM | POA: Diagnosis not present

## 2022-06-05 DIAGNOSIS — M9905 Segmental and somatic dysfunction of pelvic region: Secondary | ICD-10-CM | POA: Diagnosis not present

## 2022-06-05 DIAGNOSIS — M7542 Impingement syndrome of left shoulder: Secondary | ICD-10-CM | POA: Diagnosis not present

## 2022-06-05 DIAGNOSIS — M9902 Segmental and somatic dysfunction of thoracic region: Secondary | ICD-10-CM | POA: Diagnosis not present

## 2022-06-05 DIAGNOSIS — M9903 Segmental and somatic dysfunction of lumbar region: Secondary | ICD-10-CM | POA: Diagnosis not present

## 2022-06-05 DIAGNOSIS — M9904 Segmental and somatic dysfunction of sacral region: Secondary | ICD-10-CM | POA: Diagnosis not present

## 2022-06-05 DIAGNOSIS — M9906 Segmental and somatic dysfunction of lower extremity: Secondary | ICD-10-CM | POA: Diagnosis not present

## 2022-06-05 DIAGNOSIS — M545 Low back pain, unspecified: Secondary | ICD-10-CM | POA: Diagnosis not present

## 2022-06-09 DIAGNOSIS — C61 Malignant neoplasm of prostate: Secondary | ICD-10-CM | POA: Diagnosis not present

## 2022-06-26 DIAGNOSIS — M545 Low back pain, unspecified: Secondary | ICD-10-CM | POA: Diagnosis not present

## 2022-06-26 DIAGNOSIS — M9904 Segmental and somatic dysfunction of sacral region: Secondary | ICD-10-CM | POA: Diagnosis not present

## 2022-06-26 DIAGNOSIS — M9901 Segmental and somatic dysfunction of cervical region: Secondary | ICD-10-CM | POA: Diagnosis not present

## 2022-06-26 DIAGNOSIS — M7542 Impingement syndrome of left shoulder: Secondary | ICD-10-CM | POA: Diagnosis not present

## 2022-06-26 DIAGNOSIS — M7918 Myalgia, other site: Secondary | ICD-10-CM | POA: Diagnosis not present

## 2022-06-26 DIAGNOSIS — M9906 Segmental and somatic dysfunction of lower extremity: Secondary | ICD-10-CM | POA: Diagnosis not present

## 2022-06-26 DIAGNOSIS — M9903 Segmental and somatic dysfunction of lumbar region: Secondary | ICD-10-CM | POA: Diagnosis not present

## 2022-06-26 DIAGNOSIS — M9905 Segmental and somatic dysfunction of pelvic region: Secondary | ICD-10-CM | POA: Diagnosis not present

## 2022-06-26 DIAGNOSIS — M9902 Segmental and somatic dysfunction of thoracic region: Secondary | ICD-10-CM | POA: Diagnosis not present

## 2022-07-10 DIAGNOSIS — N529 Male erectile dysfunction, unspecified: Secondary | ICD-10-CM | POA: Diagnosis not present

## 2022-07-10 DIAGNOSIS — C61 Malignant neoplasm of prostate: Secondary | ICD-10-CM | POA: Diagnosis not present

## 2022-08-07 ENCOUNTER — Encounter: Payer: Federal, State, Local not specified - PPO | Admitting: Family Medicine

## 2022-08-14 DIAGNOSIS — M9906 Segmental and somatic dysfunction of lower extremity: Secondary | ICD-10-CM | POA: Diagnosis not present

## 2022-08-14 DIAGNOSIS — M9905 Segmental and somatic dysfunction of pelvic region: Secondary | ICD-10-CM | POA: Diagnosis not present

## 2022-08-14 DIAGNOSIS — M9902 Segmental and somatic dysfunction of thoracic region: Secondary | ICD-10-CM | POA: Diagnosis not present

## 2022-08-14 DIAGNOSIS — M9904 Segmental and somatic dysfunction of sacral region: Secondary | ICD-10-CM | POA: Diagnosis not present

## 2022-08-14 DIAGNOSIS — M7542 Impingement syndrome of left shoulder: Secondary | ICD-10-CM | POA: Diagnosis not present

## 2022-08-14 DIAGNOSIS — M9901 Segmental and somatic dysfunction of cervical region: Secondary | ICD-10-CM | POA: Diagnosis not present

## 2022-08-14 DIAGNOSIS — M545 Low back pain, unspecified: Secondary | ICD-10-CM | POA: Diagnosis not present

## 2022-08-14 DIAGNOSIS — M9903 Segmental and somatic dysfunction of lumbar region: Secondary | ICD-10-CM | POA: Diagnosis not present

## 2022-08-14 DIAGNOSIS — M7918 Myalgia, other site: Secondary | ICD-10-CM | POA: Diagnosis not present

## 2022-08-14 DIAGNOSIS — M47812 Spondylosis without myelopathy or radiculopathy, cervical region: Secondary | ICD-10-CM | POA: Diagnosis not present

## 2022-08-28 ENCOUNTER — Encounter: Payer: Federal, State, Local not specified - PPO | Admitting: Family Medicine

## 2022-09-09 DIAGNOSIS — F4320 Adjustment disorder, unspecified: Secondary | ICD-10-CM | POA: Diagnosis not present

## 2022-09-11 DIAGNOSIS — M545 Low back pain, unspecified: Secondary | ICD-10-CM | POA: Diagnosis not present

## 2022-09-11 DIAGNOSIS — M7918 Myalgia, other site: Secondary | ICD-10-CM | POA: Diagnosis not present

## 2022-09-11 DIAGNOSIS — M9904 Segmental and somatic dysfunction of sacral region: Secondary | ICD-10-CM | POA: Diagnosis not present

## 2022-09-11 DIAGNOSIS — M9901 Segmental and somatic dysfunction of cervical region: Secondary | ICD-10-CM | POA: Diagnosis not present

## 2022-09-11 DIAGNOSIS — M7542 Impingement syndrome of left shoulder: Secondary | ICD-10-CM | POA: Diagnosis not present

## 2022-09-11 DIAGNOSIS — M9905 Segmental and somatic dysfunction of pelvic region: Secondary | ICD-10-CM | POA: Diagnosis not present

## 2022-09-11 DIAGNOSIS — M9906 Segmental and somatic dysfunction of lower extremity: Secondary | ICD-10-CM | POA: Diagnosis not present

## 2022-09-11 DIAGNOSIS — M9903 Segmental and somatic dysfunction of lumbar region: Secondary | ICD-10-CM | POA: Diagnosis not present

## 2022-09-11 DIAGNOSIS — M9902 Segmental and somatic dysfunction of thoracic region: Secondary | ICD-10-CM | POA: Diagnosis not present

## 2022-09-18 ENCOUNTER — Encounter: Payer: Self-pay | Admitting: Family Medicine

## 2022-09-18 ENCOUNTER — Ambulatory Visit (INDEPENDENT_AMBULATORY_CARE_PROVIDER_SITE_OTHER): Payer: Federal, State, Local not specified - PPO | Admitting: Family Medicine

## 2022-09-18 VITALS — BP 114/71 | HR 52 | Ht 69.0 in | Wt 152.0 lb

## 2022-09-18 DIAGNOSIS — Z23 Encounter for immunization: Secondary | ICD-10-CM

## 2022-09-18 DIAGNOSIS — E291 Testicular hypofunction: Secondary | ICD-10-CM

## 2022-09-18 DIAGNOSIS — Z13220 Encounter for screening for lipoid disorders: Secondary | ICD-10-CM | POA: Diagnosis not present

## 2022-09-18 DIAGNOSIS — Z0000 Encounter for general adult medical examination without abnormal findings: Secondary | ICD-10-CM | POA: Diagnosis not present

## 2022-09-18 LAB — CBC WITH DIFFERENTIAL/PLATELET
Absolute Monocytes: 511 cells/uL (ref 200–950)
Hemoglobin: 15.3 g/dL (ref 13.2–17.1)
MCV: 90.3 fL (ref 80.0–100.0)
MPV: 9.3 fL (ref 7.5–12.5)
Neutrophils Relative %: 78.9 %
RBC: 5.16 10*6/uL (ref 4.20–5.80)
Total Lymphocyte: 12.6 %

## 2022-09-18 MED ORDER — ASPIRIN 81 MG PO TBEC
81.0000 mg | DELAYED_RELEASE_TABLET | Freq: Every day | ORAL | 12 refills | Status: DC
Start: 2022-09-18 — End: 2023-05-03

## 2022-09-18 NOTE — Progress Notes (Signed)
Terry Jenkins - 52 y.o. male MRN [id number]  Date of birth: [birth year]  Subjective Chief Complaint  Patient presents with   Labs Only    HPI Terry Jenkins is a 52 y.o. male here today for annual exam.    Dx with prostate cancer previously, continues to see urology. Has f/u in June/July to have repeat PSA.  No treatment so far. He is wanting several hormones tested today.   He continues to stay pretty active.  He feels that his diet is pretty good.   He is a non-smoker. No EtOH use at this time.    Review of Systems  Constitutional:  Negative for chills, fever, malaise/fatigue and weight loss.  HENT:  Negative for congestion, ear pain and sore throat.   Eyes:  Negative for blurred vision, double vision and pain.  Respiratory:  Negative for cough and shortness of breath.   Cardiovascular:  Negative for chest pain and palpitations.  Gastrointestinal:  Negative for abdominal pain, blood in stool, constipation, heartburn and nausea.  Genitourinary:  Negative for dysuria and urgency.  Musculoskeletal:  Negative for joint pain and myalgias.  Neurological:  Negative for dizziness and headaches.  Endo/Heme/Allergies:  Does not bruise/bleed easily.  Psychiatric/Behavioral:  Negative for depression. The patient is not nervous/anxious and does not have insomnia.     Allergies  Allergen Reactions   Ampicillin Rash    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HCC)    Seasonal allergies    Varicocele     Past Surgical History:  Procedure Laterality Date   ANAL FISSURE REPAIR     HERNIA REPAIR      Social History   Socioeconomic History   Marital status: Single    Spouse name: Not on file   Number of children: Not on file   Years of education: Not on file   Highest education level: Not on file  Occupational History   Not on file  Tobacco Use   Smoking status: Former    Packs/day: 1.00    Years: 17.00     Additional pack years: 0.00    Total pack years: 17.00    Types: Cigarettes    Quit date: 03/04/2004    Years since quitting: 18.5    Passive exposure: Never   Smokeless tobacco: Former    Types: Snuff    Quit date: 05/08/2020   Tobacco comments:    started snuff in 2019  Vaping Use   Vaping Use: Never used  Substance and Sexual Activity   Alcohol use: No   Drug use: Yes    Types: Marijuana    Comment: Edible   Sexual activity: Not Currently    Partners: Female  Other Topics Concern   Not on file  Social History Narrative   Not on file   Social Determinants of Health   Financial Resource Strain: Not on file  Food Insecurity: Not on file  Transportation Needs: Not on file  Physical Activity: Not on file  Stress: Not on file  Social Connections: Not on file    Family History  Problem Relation Age of Onset   Hypertension Mother    Hyperlipidemia Mother    Colon polyps Mother    Brain cancer Father    Healthy Sister    Colon cancer Maternal Grandmother    Prostate cancer Paternal Uncle    Cancer - Prostate Paternal Uncle  Cancer - Prostate Paternal Uncle     Health Maintenance  Topic Date Due   COLONOSCOPY (Pts 45-61yrs Insurance coverage will need to be confirmed)  09/19/2022 (Originally 05/27/2016)   COVID-19 Vaccine (2 - Janssen risk series) 01/19/2023 (Originally 09/07/2019)   Zoster Vaccines- Shingrix (1 of 2) 01/19/2023 (Originally 05/27/1990)   INFLUENZA VACCINE  12/03/2022   DTaP/Tdap/Td (3 - Td or Tdap) 09/17/2032   Hepatitis C Screening  Completed   HIV Screening  Completed   HPV VACCINES  Aged Out     ----------------------------------------------------------------------------------------------------------------------------------------------------------------------------------------------------------------- Physical Exam BP 114/71 (BP Location: Left Arm, Patient Position: Sitting, Cuff Size: Normal)   Pulse (!) 52   Ht 5\' 9"  (1.753 m)   Wt 152 lb  (68.9 kg)   SpO2 100%   BMI 22.45 kg/m   Physical Exam Constitutional:      General: He is not in acute distress. HENT:     Head: Normocephalic and atraumatic.     Right Ear: Tympanic membrane and external ear normal.     Left Ear: Tympanic membrane and external ear normal.  Eyes:     General: No scleral icterus. Neck:     Thyroid: No thyromegaly.  Cardiovascular:     Rate and Rhythm: Normal rate and regular rhythm.     Heart sounds: Normal heart sounds.  Pulmonary:     Effort: Pulmonary effort is normal.     Breath sounds: Normal breath sounds.  Abdominal:     General: Bowel sounds are normal. There is no distension.     Palpations: Abdomen is soft.     Tenderness: There is no abdominal tenderness. There is no guarding.  Musculoskeletal:     Cervical back: Normal range of motion.  Lymphadenopathy:     Cervical: No cervical adenopathy.  Skin:    General: Skin is warm and dry.     Findings: No rash.  Neurological:     Mental Status: He is alert and oriented to person, place, and time.     Cranial Nerves: No cranial nerve deficit.     Motor: No abnormal muscle tone.  Psychiatric:        Mood and Affect: Mood normal.        Behavior: Behavior normal.     ------------------------------------------------------------------------------------------------------------------------------------------------------------------------------------------------------------------- Assessment and Plan  Well adult exam Well adult Orders Placed This Encounter  Procedures   Tdap vaccine greater than or equal to 7yo IM   Estradiol   Testosterone Total,Free,Bio, Males   Prolactin   FSH/LH   DHEA-sulfate   Progesterone   Cortisol   CBC with Differential   Lipid Panel w/reflex Direct LDL   TSH   COMPLETE METABOLIC PANEL WITH GFR   B12   Vitamin D (25 hydroxy)  Screenings: per lab orders Immunizations: Declines at this time.  Anticipatory guidance/Risk factor reduction:   Recommendations per AVS.   Hypogonadism in male Updating labs.  He is aware that he can not undergo any testosterone therapy treatment with active prostate cancer.     Meds ordered this encounter  Medications   aspirin EC 81 MG tablet    Sig: Take 1 tablet (81 mg total) by mouth daily. Swallow whole.    Dispense:  30 tablet    Refill:  12    No follow-ups on file.    This visit occurred during the SARS-CoV-2 public health emergency.  Safety protocols were in place, including screening questions prior to the visit, additional usage of staff PPE, and extensive cleaning  of exam room while observing appropriate contact time as indicated for disinfecting solutions.

## 2022-09-18 NOTE — Assessment & Plan Note (Signed)
Well adult Orders Placed This Encounter  Procedures   Tdap vaccine greater than or equal to 51yo IM   Estradiol   Testosterone Total,Free,Bio, Males   Prolactin   FSH/LH   DHEA-sulfate   Progesterone   Cortisol   CBC with Differential   Lipid Panel w/reflex Direct LDL   TSH   COMPLETE METABOLIC PANEL WITH GFR   B12   Vitamin D (25 hydroxy)  Screenings: per lab orders Immunizations: Declines at this time.  Anticipatory guidance/Risk factor reduction:  Recommendations per AVS.

## 2022-09-18 NOTE — Assessment & Plan Note (Signed)
Updating labs.  He is aware that he can not undergo any testosterone therapy treatment with active prostate cancer.

## 2022-09-18 NOTE — Patient Instructions (Signed)

## 2022-09-19 LAB — COMPLETE METABOLIC PANEL WITH GFR
AG Ratio: 2.3 (calc) (ref 1.0–2.5)
ALT: 23 U/L (ref 9–46)
AST: 25 U/L (ref 10–35)
Albumin: 4.6 g/dL (ref 3.6–5.1)
Alkaline phosphatase (APISO): 65 U/L (ref 35–144)
BUN: 18 mg/dL (ref 7–25)
CO2: 30 mmol/L (ref 20–32)
Calcium: 9.7 mg/dL (ref 8.6–10.3)
Chloride: 102 mmol/L (ref 98–110)
Creat: 1.17 mg/dL (ref 0.70–1.30)
Globulin: 2 g/dL (calc) (ref 1.9–3.7)
Glucose, Bld: 81 mg/dL (ref 65–99)
Potassium: 4.6 mmol/L (ref 3.5–5.3)
Sodium: 140 mmol/L (ref 135–146)
Total Bilirubin: 0.5 mg/dL (ref 0.2–1.2)
Total Protein: 6.6 g/dL (ref 6.1–8.1)
eGFR: 75 mL/min/{1.73_m2} (ref >=60)

## 2022-09-19 LAB — CBC WITH DIFFERENTIAL/PLATELET
Basophils Absolute: 37 cells/uL (ref 0–200)
Basophils Relative: 0.5 %
Eosinophils Absolute: 73 cells/uL (ref 15–500)
Eosinophils Relative: 1 %
HCT: 46.6 % (ref 38.5–50.0)
Lymphs Abs: 920 cells/uL (ref 850–3900)
MCH: 29.7 pg (ref 27.0–33.0)
MCHC: 32.8 g/dL (ref 32.0–36.0)
Monocytes Relative: 7 %
Neutro Abs: 5760 cells/uL (ref 1500–7800)
Platelets: 274 10*3/uL (ref 140–400)
RDW: 12.4 % (ref 11.0–15.0)
WBC: 7.3 10*3/uL (ref 3.8–10.8)

## 2022-09-19 LAB — LIPID PANEL W/REFLEX DIRECT LDL
Cholesterol: 230 mg/dL — ABNORMAL HIGH (ref <200)
HDL: 80 mg/dL (ref >=40)
LDL Cholesterol (Calc): 134 mg/dL (calc) — ABNORMAL HIGH
Non-HDL Cholesterol (Calc): 150 mg/dL (calc) — ABNORMAL HIGH (ref <130)
Total CHOL/HDL Ratio: 2.9 (calc) (ref <5.0)
Triglycerides: 67 mg/dL (ref <150)

## 2022-09-19 LAB — FSH/LH
FSH: 7.3 m[IU]/mL (ref 1.4–12.8)
LH: 3.5 m[IU]/mL (ref 1.5–9.3)

## 2022-09-19 LAB — VITAMIN B12: Vitamin B-12: 302 pg/mL (ref 200–1100)

## 2022-09-19 LAB — PROGESTERONE: Progesterone: <0.5 ng/mL (ref <1.4)

## 2022-09-19 LAB — TESTOSTERONE TOTAL,FREE,BIO, MALES
Albumin: 4.6 g/dL (ref 3.6–5.1)
Sex Hormone Binding: 32 nmol/L (ref 10–50)
Testosterone, Bioavailable: 136.8 ng/dL (ref 110.0–575.0)
Testosterone, Free: 65.2 pg/mL (ref 46.0–224.0)
Testosterone: 477 ng/dL (ref 250–827)

## 2022-09-19 LAB — ESTRADIOL: Estradiol: <15 pg/mL (ref <=39)

## 2022-09-19 LAB — TSH: TSH: 2.64 mIU/L (ref 0.40–4.50)

## 2022-09-19 LAB — PROLACTIN: Prolactin: 5.1 ng/mL (ref 2.0–18.0)

## 2022-09-19 LAB — VITAMIN D 25 HYDROXY (VIT D DEFICIENCY, FRACTURES): Vit D, 25-Hydroxy: 36 ng/mL (ref 30–100)

## 2022-09-19 LAB — DHEA-SULFATE: DHEA-SO4: 162 ug/dL (ref 32–279)

## 2022-09-19 LAB — CORTISOL: Cortisol, Plasma: 11.2 ug/dL

## 2022-10-07 DIAGNOSIS — F4320 Adjustment disorder, unspecified: Secondary | ICD-10-CM | POA: Diagnosis not present

## 2022-10-09 DIAGNOSIS — M9901 Segmental and somatic dysfunction of cervical region: Secondary | ICD-10-CM | POA: Diagnosis not present

## 2022-10-09 DIAGNOSIS — M7542 Impingement syndrome of left shoulder: Secondary | ICD-10-CM | POA: Diagnosis not present

## 2022-10-09 DIAGNOSIS — M9903 Segmental and somatic dysfunction of lumbar region: Secondary | ICD-10-CM | POA: Diagnosis not present

## 2022-10-09 DIAGNOSIS — M47812 Spondylosis without myelopathy or radiculopathy, cervical region: Secondary | ICD-10-CM | POA: Diagnosis not present

## 2022-10-27 ENCOUNTER — Encounter: Payer: Self-pay | Admitting: Family Medicine

## 2022-11-04 DIAGNOSIS — F4320 Adjustment disorder, unspecified: Secondary | ICD-10-CM | POA: Diagnosis not present

## 2022-11-13 DIAGNOSIS — M9904 Segmental and somatic dysfunction of sacral region: Secondary | ICD-10-CM | POA: Diagnosis not present

## 2022-11-13 DIAGNOSIS — C61 Malignant neoplasm of prostate: Secondary | ICD-10-CM | POA: Diagnosis not present

## 2022-11-13 DIAGNOSIS — M9903 Segmental and somatic dysfunction of lumbar region: Secondary | ICD-10-CM | POA: Diagnosis not present

## 2022-11-13 DIAGNOSIS — M9905 Segmental and somatic dysfunction of pelvic region: Secondary | ICD-10-CM | POA: Diagnosis not present

## 2022-11-13 DIAGNOSIS — M9906 Segmental and somatic dysfunction of lower extremity: Secondary | ICD-10-CM | POA: Diagnosis not present

## 2022-11-13 DIAGNOSIS — M7542 Impingement syndrome of left shoulder: Secondary | ICD-10-CM | POA: Diagnosis not present

## 2022-11-13 DIAGNOSIS — M7918 Myalgia, other site: Secondary | ICD-10-CM | POA: Diagnosis not present

## 2022-11-13 DIAGNOSIS — M545 Low back pain, unspecified: Secondary | ICD-10-CM | POA: Diagnosis not present

## 2022-11-13 DIAGNOSIS — M9902 Segmental and somatic dysfunction of thoracic region: Secondary | ICD-10-CM | POA: Diagnosis not present

## 2022-11-13 DIAGNOSIS — M9901 Segmental and somatic dysfunction of cervical region: Secondary | ICD-10-CM | POA: Diagnosis not present

## 2022-11-23 DIAGNOSIS — C61 Malignant neoplasm of prostate: Secondary | ICD-10-CM | POA: Diagnosis not present

## 2022-11-23 DIAGNOSIS — R972 Elevated prostate specific antigen [PSA]: Secondary | ICD-10-CM | POA: Diagnosis not present

## 2022-12-03 DIAGNOSIS — F4320 Adjustment disorder, unspecified: Secondary | ICD-10-CM | POA: Diagnosis not present

## 2022-12-18 DIAGNOSIS — C61 Malignant neoplasm of prostate: Secondary | ICD-10-CM | POA: Diagnosis not present

## 2022-12-30 DIAGNOSIS — F4320 Adjustment disorder, unspecified: Secondary | ICD-10-CM | POA: Diagnosis not present

## 2023-01-11 DIAGNOSIS — F4320 Adjustment disorder, unspecified: Secondary | ICD-10-CM | POA: Diagnosis not present

## 2023-01-18 DIAGNOSIS — C61 Malignant neoplasm of prostate: Secondary | ICD-10-CM | POA: Diagnosis not present

## 2023-01-18 DIAGNOSIS — M6281 Muscle weakness (generalized): Secondary | ICD-10-CM | POA: Diagnosis not present

## 2023-02-01 DIAGNOSIS — F4320 Adjustment disorder, unspecified: Secondary | ICD-10-CM | POA: Diagnosis not present

## 2023-02-18 ENCOUNTER — Encounter: Payer: Self-pay | Admitting: Family Medicine

## 2023-02-18 ENCOUNTER — Ambulatory Visit: Payer: Federal, State, Local not specified - PPO | Admitting: Family Medicine

## 2023-02-18 VITALS — BP 146/78 | HR 69 | Ht 69.0 in | Wt 153.0 lb

## 2023-02-18 DIAGNOSIS — B029 Zoster without complications: Secondary | ICD-10-CM | POA: Insufficient documentation

## 2023-02-18 DIAGNOSIS — G4700 Insomnia, unspecified: Secondary | ICD-10-CM | POA: Diagnosis not present

## 2023-02-18 NOTE — Patient Instructions (Signed)

## 2023-02-18 NOTE — Assessment & Plan Note (Signed)
I don't think he would benefit from Valtrex at this point as his symptoms are improving.  Discussed s/s of infection.  He will let me know if new lesions appear.  He will plan to get Shingrix in the future.

## 2023-02-18 NOTE — Progress Notes (Signed)
Terry Jenkins - 52 y.o. male MRN [id number]  Date of birth: 1970-12-01  Subjective Chief Complaint  Patient presents with   Rash    HPI ABDULMAJEED Jenkins is a 52 y.o. male here today with complaint of rash.  Rash located on the lower back.  He reports last week he had pain in the front of his R hip and thigh that felt like yellow jacket stings.  Developed rash with blisters along the lower back on the R side as well.  Rash is improving at this point, only having mild pain at this time.  Denies new lesions. No fever or chills.   Has had some difficulty with staying asleep.  He is able to fall asleep pretty easily.  Has tried 3mg  melatonin without much improvement.   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)    Seasonal allergies    Varicocele     Past Surgical History:  Procedure Laterality Date   ANAL FISSURE REPAIR     HERNIA REPAIR      Social History   Socioeconomic History   Marital status: Single    Spouse name: Not on file   Number of children: Not on file   Years of education: Not on file   Highest education level: Bachelor's degree (e.g., BA, AB, BS)  Occupational History   Not on file  Tobacco Use   Smoking status: Former    Current packs/day: 0.00    Average packs/day: 1 pack/day for 17.0 years (17.0 ttl pk-yrs)    Types: Cigarettes    Start date: 03/05/1987    Quit date: 03/04/2004    Years since quitting: 18.9    Passive exposure: Never   Smokeless tobacco: Former    Types: Snuff    Quit date: 05/08/2020   Tobacco comments:    started snuff in 2019  Vaping Use   Vaping status: Never Used  Substance and Sexual Activity   Alcohol use: No   Drug use: Yes    Types: Marijuana    Comment: Edible   Sexual activity: Not Currently    Partners: Female  Other Topics  Concern   Not on file  Social History Narrative   Not on file   Social Determinants of Health   Financial Resource Strain: Low Risk  (02/16/2023)   Overall Financial Resource Strain (CARDIA)    Difficulty of Paying Living Expenses: Not hard at all  Food Insecurity: No Food Insecurity (02/16/2023)   Hunger Vital Sign    Worried About Running Out of Food in the Last Year: Never true    Ran Out of Food in the Last Year: Never true  Transportation Needs: No Transportation Needs (02/16/2023)   PRAPARE - Administrator, Civil Service (Medical): No    Lack of Transportation (Non-Medical): No  Physical Activity: Sufficiently Active (02/16/2023)   Exercise Vital Sign    Days of Exercise per Week: 4 days    Minutes of Exercise per Session: 60 min  Stress: Stress Concern Present (02/16/2023)   [employer] of Occupational Health - Occupational Stress Questionnaire    Feeling of Stress : To some extent  Social Connections: Socially Isolated (02/16/2023)   Social Connection and Isolation Panel [NHANES]  Frequency of Communication with Friends and Family: More than three times a week    Frequency of Social Gatherings with Friends and Family: Once a week    Attends Religious Services: Never    Database administrator or Organizations: No    Attends Engineer, structural: Not on file    Marital Status: Divorced    Family History  Problem Relation Age of Onset   Hypertension Mother    Hyperlipidemia Mother    Colon polyps Mother    Brain cancer Father    Healthy Sister    Colon cancer Maternal Grandmother    Prostate cancer Paternal Uncle    Cancer - Prostate Paternal Uncle    Cancer - Prostate Paternal Uncle     Health Maintenance  Topic Date Due   Zoster Vaccines- Shingrix (1 of 2) Never done   COVID-19 Vaccine (2 - Janssen risk series) 09/07/2019   INFLUENZA VACCINE  12/03/2022   Colonoscopy  10/25/2026   DTaP/Tdap/Td (3 - Td or Tdap) 09/17/2032    Hepatitis C Screening  Completed   HIV Screening  Completed   HPV VACCINES  Aged Out     ----------------------------------------------------------------------------------------------------------------------------------------------------------------------------------------------------------------- Physical Exam BP (!) 146/78 (BP Location: Right Arm, Patient Position: Sitting, Cuff Size: Normal)   Pulse 69   Ht 5\' 9"  (1.753 m)   Wt 153 lb (69.4 kg)   SpO2 98%   BMI 22.59 kg/m   Physical Exam Constitutional:      Appearance: Normal appearance.  Skin:    Comments: Vesicular type rash along R lower back, areas are starting to scab over.    Neurological:     Mental Status: He is alert.  Psychiatric:        Mood and Affect: Mood normal.        Behavior: Behavior normal.     ------------------------------------------------------------------------------------------------------------------------------------------------------------------------------------------------------------------- Assessment and Plan  Zoster I don't think he would benefit from Valtrex at this point as his symptoms are improving.  Discussed s/s of infection.  He will let me know if new lesions appear.  He will plan to get Shingrix in the future.   Insomnia Recommend trying magnesium glycinate.  He will let me know if this does not help.     No orders of the defined types were placed in this encounter.   No follow-ups on file.    This visit occurred during the SARS-CoV-2 public health emergency.  Safety protocols were in place, including screening questions prior to the visit, additional usage of staff PPE, and extensive cleaning of exam room while observing appropriate contact time as indicated for disinfecting solutions.

## 2023-02-18 NOTE — Assessment & Plan Note (Signed)
Recommend trying magnesium glycinate.  He will let me know if this does not help.

## 2023-02-19 ENCOUNTER — Other Ambulatory Visit: Payer: Self-pay | Admitting: Urology

## 2023-02-22 DIAGNOSIS — F4320 Adjustment disorder, unspecified: Secondary | ICD-10-CM | POA: Diagnosis not present

## 2023-03-10 ENCOUNTER — Ambulatory Visit: Payer: Federal, State, Local not specified - PPO | Admitting: Family Medicine

## 2023-03-17 DIAGNOSIS — F4320 Adjustment disorder, unspecified: Secondary | ICD-10-CM | POA: Diagnosis not present

## 2023-03-19 DIAGNOSIS — M62838 Other muscle spasm: Secondary | ICD-10-CM | POA: Diagnosis not present

## 2023-03-19 DIAGNOSIS — N393 Stress incontinence (female) (male): Secondary | ICD-10-CM | POA: Diagnosis not present

## 2023-03-19 DIAGNOSIS — M6281 Muscle weakness (generalized): Secondary | ICD-10-CM | POA: Diagnosis not present

## 2023-04-07 DIAGNOSIS — F4320 Adjustment disorder, unspecified: Secondary | ICD-10-CM | POA: Diagnosis not present

## 2023-04-21 DIAGNOSIS — F4320 Adjustment disorder, unspecified: Secondary | ICD-10-CM | POA: Diagnosis not present

## 2023-04-26 ENCOUNTER — Encounter: Payer: Self-pay | Admitting: Family Medicine

## 2023-04-30 DIAGNOSIS — C61 Malignant neoplasm of prostate: Secondary | ICD-10-CM | POA: Diagnosis not present

## 2023-05-03 NOTE — Patient Instructions (Addendum)
 SURGICAL WAITING ROOM VISITATION Patients having surgery or a procedure may have no more than 2 support people in the waiting area - these visitors may rotate.    Children under the age of 12 must have an adult with them who is not the patient.  If the patient needs to stay at the hospital during part of their recovery, the visitor guidelines for inpatient rooms apply. Pre-op nurse will coordinate an appropriate time for 1 support person to accompany patient in pre-op.  This support person may not rotate.    Please refer to the [facility] website for the visitor guidelines for Inpatients (after your surgery is over and you are in a regular room).       Your procedure is scheduled on: 05-24-23   Report to [facility] Main [facility]    Report to admitting at 5:15 AM   Call this number if you have problems the morning of surgery [phone]   Follow a clear liquid diet the day before surgery   After Midnight.- no liquids            If you have questions, please contact your surgeon's office.   FOLLOW BOWEL PREP AND ANY ADDITIONAL PRE OP INSTRUCTIONS YOU RECEIVED FROM YOUR SURGEON'S OFFICE!!!   Follow a clear liquid the day before surgery  Magnesium citrate - drink 8 ounces at noon the day before surgery  Fleet enema - administer the night before surgery   Oral Hygiene is also important to reduce your risk of infection.                                    Remember - BRUSH YOUR TEETH THE MORNING OF SURGERY WITH YOUR REGULAR TOOTHPASTE   Do NOT smoke after Midnight   Take these medicines the morning of surgery with A SIP OF WATER :  None  Stop all vitamins and herbal supplements 7 days before surgery                              You may not have any metal on your body including  jewelry, and body piercing             Do not wear  lotions, powders, cologne, or deodorant              Men may shave face and neck.   Do not bring valuables to the hospital. [facility] IS  NOT RESPONSIBLE   FOR VALUABLES.   Contacts, dentures or bridgework may not be worn into surgery.   Bring small overnight bag day of surgery.   DO NOT BRING YOUR HOME MEDICATIONS TO THE HOSPITAL. PHARMACY WILL DISPENSE MEDICATIONS LISTED ON YOUR MEDICATION LIST TO YOU DURING YOUR ADMISSION IN THE HOSPITAL!    Special Instructions: Bring a copy of your healthcare power of attorney and living will documents the day of surgery if you haven't scanned them before.              Please read over the following fact sheets you were given: IF YOU HAVE QUESTIONS ABOUT YOUR PRE-OP INSTRUCTIONS PLEASE CALL [phone] [facility]  If you received a COVID test during your pre-op visit  it is requested that you wear a mask when out in public, stay away from anyone that may not be feeling well and notify your surgeon if you develop  symptoms. If you test positive for Covid or have been in contact with anyone that has tested positive in the last 10 days please notify you surgeon.  [facility] - Preparing for Surgery Before surgery, you can play an important role.  Because skin is not sterile, your skin needs to be as free of germs as possible.  You can reduce the number of germs on your skin by washing with CHG (chlorahexidine gluconate) soap before surgery.  CHG is an antiseptic cleaner which kills germs and bonds with the skin to continue killing germs even after washing. Please DO NOT use if you have an allergy to CHG or antibacterial soaps.  If your skin becomes reddened/irritated stop using the CHG and inform your nurse when you arrive at Short Stay. Do not shave (including legs and underarms) for at least 48 hours prior to the first CHG shower.  You may shave your face/neck.  Please follow these instructions carefully:  1.  Shower with CHG Soap the night before surgery and the  morning of surgery.  2.  If you choose to wash your hair, wash your hair first as usual with your normal  shampoo.  3.  After you  shampoo, rinse your hair and body thoroughly to remove the shampoo.                             4.  Use CHG as you would any other liquid soap.  You can apply chg directly to the skin and wash.  Gently with a scrungie or clean washcloth.  5.  Apply the CHG Soap to your body ONLY FROM THE NECK DOWN.   Do   not use on face/ open                           Wound or open sores. Avoid contact with eyes, ears mouth and   genitals (private parts).                       Wash face,  Genitals (private parts) with your normal soap.             6.  Wash thoroughly, paying special attention to the area where your    surgery  will be performed.  7.  Thoroughly rinse your body with warm water  from the neck down.  8.  DO NOT shower/wash with your normal soap after using and rinsing off the CHG Soap.                9.  Pat yourself dry with a clean towel.            10.  Wear clean pajamas.            11.  Place clean sheets on your bed the night of your first shower and do not  sleep with pets. Day of Surgery : Do not apply any lotions/deodorants the morning of surgery.  Please wear clean clothes to the hospital/surgery center.  FAILURE TO FOLLOW THESE INSTRUCTIONS MAY RESULT IN THE CANCELLATION OF YOUR SURGERY  PATIENT SIGNATURE_________________________________  NURSE SIGNATURE__________________________________  ________________________________________________________________________   WHAT IS A BLOOD TRANSFUSION? Blood Transfusion Information  A transfusion is the replacement of blood or some of its parts. Blood is made up of multiple cells which provide different functions. Red blood cells carry oxygen and are used for blood  loss replacement. White blood cells fight against infection. Platelets control bleeding. Plasma helps clot blood. Other blood products are available for specialized needs, such as hemophilia or other clotting disorders. BEFORE THE TRANSFUSION  Who gives blood for transfusions?   Healthy volunteers who are fully evaluated to make sure their blood is safe. This is blood bank blood. Transfusion therapy is the safest it has ever been in the practice of medicine. Before blood is taken from a donor, a complete history is taken to make sure that person has no history of diseases nor engages in risky social behavior (examples are intravenous drug use or sexual activity with multiple partners). The donor's travel history is screened to minimize risk of transmitting infections, such as malaria. The donated blood is tested for signs of infectious diseases, such as HIV and hepatitis. The blood is then tested to be sure it is compatible with you in order to minimize the chance of a transfusion reaction. If you or a relative donates blood, this is often done in anticipation of surgery and is not appropriate for emergency situations. It takes many days to process the donated blood. RISKS AND COMPLICATIONS Although transfusion therapy is very safe and saves many lives, the main dangers of transfusion include:  Getting an infectious disease. Developing a transfusion reaction. This is an allergic reaction to something in the blood you were given. Every precaution is taken to prevent this. The decision to have a blood transfusion has been considered carefully by your caregiver before blood is given. Blood is not given unless the benefits outweigh the risks. AFTER THE TRANSFUSION Right after receiving a blood transfusion, you will usually feel much better and more energetic. This is especially true if your red blood cells have gotten low (anemic). The transfusion raises the level of the red blood cells which carry oxygen, and this usually causes an energy increase. The nurse administering the transfusion will monitor you carefully for complications. HOME CARE INSTRUCTIONS  No special instructions are needed after a transfusion. You may find your energy is better. Speak with your caregiver about  any limitations on activity for underlying diseases you may have. SEEK MEDICAL CARE IF:  Your condition is not improving after your transfusion. You develop redness or irritation at the intravenous (IV) site. SEEK IMMEDIATE MEDICAL CARE IF:  Any of the following symptoms occur over the next 12 hours: Shaking chills. You have a temperature by mouth above 102 F (38.9 C), not controlled by medicine. Chest, back, or muscle pain. People around you feel you are not acting correctly or are confused. Shortness of breath or difficulty breathing. Dizziness and fainting. You get a rash or develop hives. You have a decrease in urine output. Your urine turns a dark color or changes to pink, red, or brown. Any of the following symptoms occur over the next 10 days: You have a temperature by mouth above 102 F (38.9 C), not controlled by medicine. Shortness of breath. Weakness after normal activity. The white part of the eye turns yellow (jaundice). You have a decrease in the amount of urine or are urinating less often. Your urine turns a dark color or changes to pink, red, or brown. Document Released: 04/17/2000 Document Revised: 07/13/2011 Document Reviewed: 12/05/2007 [facility] Patient Information 2014 [place], [place].  _______________________________________________________________________

## 2023-05-11 ENCOUNTER — Encounter (HOSPITAL_COMMUNITY): Payer: Self-pay

## 2023-05-11 ENCOUNTER — Encounter (HOSPITAL_COMMUNITY)
Admission: RE | Admit: 2023-05-11 | Discharge: 2023-05-11 | Disposition: A | Discharge status: Home / Self Care | Payer: Federal, State, Local not specified - PPO | Source: Ambulatory Visit | Attending: Urology | Admitting: Urology

## 2023-05-11 ENCOUNTER — Other Ambulatory Visit: Payer: Self-pay

## 2023-05-11 DIAGNOSIS — C61 Malignant neoplasm of prostate: Secondary | ICD-10-CM | POA: Diagnosis not present

## 2023-05-11 DIAGNOSIS — Z01812 Encounter for preprocedural laboratory examination: Secondary | ICD-10-CM | POA: Insufficient documentation

## 2023-05-11 DIAGNOSIS — Z01818 Encounter for other preprocedural examination: Secondary | ICD-10-CM | POA: Diagnosis not present

## 2023-05-11 HISTORY — DX: Unspecified osteoarthritis, unspecified site: M19.90

## 2023-05-11 HISTORY — DX: Family history of other specified conditions: Z84.89

## 2023-05-11 HISTORY — DX: Malignant neoplasm of prostate: C61

## 2023-05-11 LAB — TYPE AND SCREEN
ABO/RH(D): O POS
Antibody Screen: NEGATIVE

## 2023-05-11 LAB — BASIC METABOLIC PANEL
Anion gap: 5 (ref 5–15)
BUN: 14 mg/dL (ref 6–20)
CO2: 29 mmol/L (ref 22–32)
Calcium: 8.9 mg/dL (ref 8.9–10.3)
Chloride: 104 mmol/L (ref 98–111)
Creatinine, Ser: 1.02 mg/dL (ref 0.61–1.24)
GFR, Estimated: >60 mL/min (ref >60)
Glucose, Bld: 92 mg/dL (ref 70–99)
Potassium: 3.8 mmol/L (ref 3.5–5.1)
Sodium: 138 mmol/L (ref 135–145)

## 2023-05-11 LAB — CBC
HCT: 46.6 % (ref 39.0–52.0)
Hemoglobin: 15.2 g/dL (ref 13.0–17.0)
MCH: 30.3 pg (ref 26.0–34.0)
MCHC: 32.6 g/dL (ref 30.0–36.0)
MCV: 93 fL (ref 80.0–100.0)
Platelets: 250 10*3/uL (ref 150–400)
RBC: 5.01 MIL/uL (ref 4.22–5.81)
RDW: 12.6 % (ref 11.5–15.5)
WBC: 6.4 10*3/uL (ref 4.0–10.5)
nRBC: 0 % (ref 0.0–0.2)

## 2023-05-11 NOTE — Progress Notes (Addendum)
 COVID Vaccine Completed:  Yes  Date of COVID positive in last 90 days:   No  PCP - Velma Ku, DO Cardiologist - N/A  Chest x-ray -  N/A EKG -  N/A Stress Test -  N/A ECHO -  N/A Cardiac Cath -  N/A Pacemaker/ICD device last checked: Spinal Cord Stimulator: N/A  Bowel Prep - Yes, Patient has instructions  Sleep Study - N/A CPAP -   Fasting Blood Sugar - N/A Checks Blood Sugar _____ times a day  Last dose of GLP1 agonist-  N/A GLP1 instructions:  Hold 7 days before surgery    Last dose of SGLT-2 inhibitors-  N/A SGLT-2 instructions:  Hold 3 days before surgery   Blood Thinner Instructions:  N/A Aspirin  Instructions: Last Dose:  Activity level:  Can go up a flight of stairs and perform activities of daily living without stopping and without symptoms of chest pain or shortness of breath.  Able to exercise without symptoms  Anesthesia review:  N/A  Patient denies shortness of breath, fever, cough and chest pain at PAT appointment  Patient verbalized understanding of instructions that were given to them at the PAT appointment. Patient was also instructed that they will need to review over the PAT instructions again at home before surgery.

## 2023-05-17 DIAGNOSIS — F4320 Adjustment disorder, unspecified: Secondary | ICD-10-CM | POA: Diagnosis not present

## 2023-05-21 ENCOUNTER — Encounter (HOSPITAL_COMMUNITY): Payer: Self-pay | Admitting: Urology

## 2023-05-21 NOTE — H&P (Signed)
Office Visit Report     05/11/2023   --------------------------------------------------------------------------------   Terry Jenkins  MRN: [id number]  DOB: Oct 30, 1970, 53 year old Male  SSN: [id number]   PRIMARY CARE:  Mammie Lorenzo, DO  PRIMARY CARE FAX:  [phone]  REFERRING:  Mammie Lorenzo, DO  PROVIDER:  Alen Blew Lafonda Mosses, M.D.  TREATING:  Heloise Purpura, M.D.  LOCATION:  [facility] [phone]     --------------------------------------------------------------------------------   CC/HPI: CC: Prostate Cancer   Mr. Diperna is a 52 year old gentleman who initially was evaluated by Dr. Lafonda Mosses for hypogonadism complaints and an elevated PSA of 5.15. He underwent an initial TRUS biopsy of the prostate on 10/06/21 that indicated low risk prostate cancer with Gleason 3+3=6 adenocarcinoma in 4 out of 12 biopsy cores. He proceeded with active surveillance management and underwent an MRI of the prostate on 03/31/22 that indicated a 1 cm PIRADS 4 lesion of the left mid prostate and a 1.3 cm PIRADS 4 lesion of the right base. He had an MR/US fusion biopsy on 05/22/22 that indicated upgraded Gleason 3+4=7 adenocarcinoma with 4 out of 12 systematic biopsies positive and all 6 targeted biopsies positive. His most recent PSA was 7.77. He has now made the decision to proceed with surgical therapy.   Family history: He has 2 paternal uncles who both had prostate cancer in their 68s and were treated with surgical therapy.   Imaging studies: MRI (03/31/22): No EPE, SVI, LAD, or bone lesions.   PMH: He has no major medical comorbidities.  PSH: Left inguinal hernia repair.   TNM stage: cT1c N0 Mx  PSA: 7.77  Gleason score: 3+4=7 (GG 2)  Biopsy (05/22/22): 10/18 cores positive  Left: L lateral apex (10%, 3+3=6)  Right: R mid (10%, 3+4=7), R lateral mid (30%, 3+4=7), R base (40%, 3+3=6)  Targets: ROI -1 (L mid) - 3/3 cores, 3+3=6, (30%, 30%, 30%), ROI-2 (R base) - 3/3 cores,  3+3=6, (10%, 10%, 10%)  Prostate volume: 20.6 cc   Nomogram  OC disease: 59%  EPE: 40%  SVI: 4%  LNI: 5%  PFS (5 year, 10 year): 80%, 68%   Urinary function: IPSS is 16. This is mostly related to urinary frequency and urgency symptoms have been longstanding most of his life.  Erectile function: SHIM score is 17. He does now have a partner since his last visit with me. He has been able to achieve erections adequate for intercourse. He typically takes tadalafil 5 mg daily.     ALLERGIES: Ampicillin    MEDICATIONS: Diazepam 10 mg tablet 1 tablet PO once take 30-40 minutes before your procedure  Tadalafil 5 mg tablet 1 tablet PO Daily  Ashwaganda  Aspir 81 81 mg tablet, delayed release  Boron  Fenugreek  Fish Oil  L-Glutamine  Multiple Vitamin  Potassium     GU PSH: Prostate Needle Biopsy - 05/22/2022, 10/06/2021       PSH Notes: Anal Fissure in 2002 Wisdom Teeth   NON-GU PSH: Hernia Repair, Left, Ing. - 2008 Surgical Pathology, Gross And Microscopic Examination For Prostate Needle - 05/22/2022, 10/06/2021     GU PMH: Stress Incontinence - 03/19/2023 Prostate Cancer - 01/18/2023, - 12/18/2022, - 11/23/2022, - 07/10/2022, - 06/09/2022, - 05/29/2022, - 05/22/2022, - 04/10/2022, - 10/17/2021 Elevated PSA - 11/23/2022, - 05/29/2022, - 04/10/2022, - 10/17/2021, - 10/06/2021, - 09/11/2021 Male ED, unspecified - 07/10/2022 Encounter for Prostate Cancer screening (Stable, Chronic) - 2018 Primary hypogonadism (Stable, Chronic) -  2018 Varicocele (Chronic) - 2018    NON-GU PMH: Muscle weakness (generalized) - 03/19/2023, - 01/18/2023 Other muscle spasm - 03/19/2023 Low testosterone - 09/11/2021    FAMILY HISTORY: Brain Cancer - Father Prostate Cancer - Uncle   SOCIAL HISTORY: Marital Status: Single Preferred Language: English; Ethnicity: Not Hispanic Or Latino; Race: White Current Smoking Status: Patient does not smoke anymore. Smoked for 14 years.   Tobacco Use Assessment Completed: Used Tobacco  in last 30 days? Social Drinker.  Drinks 4+ caffeinated drinks per day. Patient's occupation Consulting civil engineer.    REVIEW OF SYSTEMS:    GU Review Male:   Patient denies frequent urination, hard to postpone urination, burning/ pain with urination, get up at night to urinate, leakage of urine, stream starts and stops, trouble starting your streams, and have to strain to urinate .  Gastrointestinal (Upper):   Patient denies nausea and vomiting.  Gastrointestinal (Lower):   Patient denies diarrhea and constipation.  Constitutional:   Patient denies fever, night sweats, weight loss, and fatigue.  Skin:   Patient denies skin rash/ lesion and itching.  Eyes:   Patient denies blurred vision and double vision.  Ears/ Nose/ Throat:   Patient denies sore throat and sinus problems.  Hematologic/Lymphatic:   Patient denies easy bruising and swollen glands.  Cardiovascular:   Patient denies leg swelling and chest pains.  Respiratory:   Patient denies cough and shortness of breath.  Endocrine:   Patient denies excessive thirst.  Musculoskeletal:   Patient denies back pain and joint pain.  Neurological:   Patient denies headaches and dizziness.  Psychologic:   Patient denies depression and anxiety.   VITAL SIGNS:      05/11/2023 09:33 AM  Weight 155 lb / 70.31 kg  Height 69 in / 175.26 cm  BP 119/80 mmHg  Pulse 55 /min  Temperature 96.7 F / 35.9 C  BMI 22.9 kg/m   GU PHYSICAL EXAMINATION:    Prostate: Prostate about 50 grams. Left lobe normal consistency, right lobe normal consistency. Symmetrical lobes. No prostate nodule. Left lobe no tenderness, right lobe no tenderness.    MULTI-SYSTEM PHYSICAL EXAMINATION:    Constitutional: Well-nourished. No physical deformities. Normally developed. Good grooming.  Respiratory: No labored breathing, no use of accessory muscles. Clear bilaterally.  Cardiovascular: Normal temperature, normal extremity pulses, no swelling, no varicosities. Regular rate  and rhythm.  Gastrointestinal: No mass, no tenderness, no rigidity, non obese abdomen.      Complexity of Data:  Lab Test Review:   PSA  Records Review:   Pathology Reports, Previous Patient Records   11/13/22 04/03/22 09/11/21  PSA  Total PSA 7.77 ng/mL 5.85 ng/mL 5.15 ng/mL    09/11/21  Hormones  Testosterone, Total 329.5 ng/dL    PROCEDURES:          Urinalysis Dipstick Dipstick Cont'd  Color: Yellow Bilirubin: Neg mg/dL  Appearance: Clear Ketones: Neg mg/dL  Specific Gravity: 4.132 Blood: Neg ery/uL  pH: 7.0 Protein: Neg mg/dL  Glucose: Neg mg/dL Urobilinogen: 0.2 mg/dL    Nitrites: Neg    Leukocyte Esterase: Neg leu/uL    ASSESSMENT:      ICD-10 Details  1 GU:   Prostate Cancer - C61    PLAN:           Schedule Return Visit/Planned Activity: Keep Scheduled Appointment          Document Letter(s):  Created for Patient: Clinical Summary         Notes:  1. Prostate cancer: We reviewed his PSA which has continued to increase to 10.3. This confirms his decision to proceed with curative therapy and he confirms his decision to proceed with surgical therapy. The patient was counseled about the natural history of prostate cancer and the standard treatment options that are available for prostate cancer. It was explained to him how his age and life expectancy, clinical stage, Gleason score/prognostic grade group, and PSA (and PSA density) affect his prognosis, the decision to proceed with additional staging studies, as well as how that information influences recommended treatment strategies. We discussed the roles for active surveillance, radiation therapy, surgical therapy, androgen deprivation, as well as ablative therapy and other investigational options for the treatment of prostate cancer as appropriate to his individual cancer situation. We discussed the risks and benefits of these options with regard to their impact on cancer control and also in terms of potential adverse  events, complications, and impact on quality of life particularly related to urinary and sexual function. The patient was encouraged to ask questions throughout the discussion today and all questions were answered to his stated satisfaction. In addition, the patient was provided with and/or directed to appropriate resources and literature for further education about prostate cancer and treatment options. We discussed surgical therapy for prostate cancer including the different available surgical approaches. We discussed, in detail, the risks and expectations of surgery with regard to cancer control, urinary control, and erectile function as well as the expected postoperative recovery process. Additional risks of surgery including but not limited to bleeding, infection, hernia formation, nerve damage, lymphocele formation, bowel/rectal injury potentially necessitating colostomy, damage to the urinary tract resulting in urine leakage, urethral stricture, and the cardiopulmonary risks such as myocardial infarction, stroke, death, venothromboembolism, etc. were explained. The risk of open surgical conversion for robotic/laparoscopic prostatectomy was also discussed.   He will plan to proceed with a bilateral nerve sparing robot-assisted laparoscopic radical prostatectomy and bilateral pelvic lymphadenectomy later this month.   2. Low testosterone: He is potentially interested in seeing Dr. Lafonda Mosses again in the future to originally diagnosed his prostate cancer both to discuss testosterone replacement therapy in the setting of a prostate cancer diagnosis and to potentially address any further concerns regarding erectile function. This would be appropriate. We will consider checking a PSA and testosterone level at his 28-month visit and we will then consider having him see Dr. Lafonda Mosses if appropriate at that time.   CC: Dr. Everrett Coombe  Dr. Irine Seal        Next Appointment:      Next Appointment: 05/24/2023  07:15 AM    Appointment Type: Surgery     Location: [facility] [phone]    Provider: Heloise Purpura, M.D.    Reason for Visit: WL/OBS RA LAP RAD PROSTATECTOMY LEV 2 AND BPLND WITH AMANDA      E & M CODES: We spent 38 minutes dedicated to evaluation and management time, including face to face interaction, discussions on coordination of care, documentation, result review, and discussion with others as applicable.     * Signed by Heloise Purpura, M.D. on 05/11/23 at 3:22 PM (EST)*

## 2023-05-23 NOTE — Anesthesia Preprocedure Evaluation (Signed)
Anesthesia Evaluation  Patient identified by MRN, date of birth, ID band Patient awake    Reviewed: Allergy & Precautions, NPO status , Patient's Chart, lab work & pertinent test results  History of Anesthesia Complications Negative for: history of anesthetic complications  Airway Mallampati: II  TM Distance: >3 FB Neck ROM: Full    Dental no notable dental hx.    Pulmonary former smoker   Pulmonary exam normal        Cardiovascular negative cardio ROS Normal cardiovascular exam     Neuro/Psych   Anxiety        GI/Hepatic negative GI ROS, Neg liver ROS,,,  Endo/Other  negative endocrine ROS    Renal/GU negative Renal ROS  negative genitourinary   Musculoskeletal  (+) Arthritis ,    Abdominal   Peds  Hematology negative hematology ROS (+)   Anesthesia Other Findings Prostate ca  Reproductive/Obstetrics                              Anesthesia Physical Anesthesia Plan  ASA: 2  Anesthesia Plan: General   Post-op Pain Management: Tylenol PO (pre-op)*, Ketamine IV* and Dilaudid IV   Induction: Intravenous  PONV Risk Score and Plan: 2 and Treatment may vary due to age or medical condition, Ondansetron, Dexamethasone and Midazolam  Airway Management Planned: Oral ETT  Additional Equipment:   Intra-op Plan:   Post-operative Plan: Extubation in OR  Informed Consent: I have reviewed the patients History and Physical, chart, labs and discussed the procedure including the risks, benefits and alternatives for the proposed anesthesia with the patient or authorized representative who has indicated his/her understanding and acceptance.     Dental advisory given  Plan Discussed with: CRNA  Anesthesia Plan Comments:         Anesthesia Quick Evaluation

## 2023-05-24 ENCOUNTER — Other Ambulatory Visit: Payer: Self-pay

## 2023-05-24 ENCOUNTER — Other Ambulatory Visit (HOSPITAL_COMMUNITY): Payer: Self-pay

## 2023-05-24 ENCOUNTER — Ambulatory Visit (HOSPITAL_COMMUNITY): Payer: Self-pay | Admitting: Anesthesiology

## 2023-05-24 ENCOUNTER — Observation Stay (HOSPITAL_COMMUNITY)
Admission: RE | Admit: 2023-05-24 | Discharge: 2023-05-25 | Disposition: A | Discharge status: Home / Self Care | Payer: Federal, State, Local not specified - PPO | Source: Ambulatory Visit | Attending: Urology | Admitting: Urology

## 2023-05-24 ENCOUNTER — Encounter (HOSPITAL_COMMUNITY): Payer: Self-pay | Admitting: Urology

## 2023-05-24 ENCOUNTER — Encounter (HOSPITAL_COMMUNITY)
Admission: RE | Disposition: A | Discharge status: Home / Self Care | Payer: Self-pay | Source: Ambulatory Visit | Attending: Urology

## 2023-05-24 DIAGNOSIS — Z87891 Personal history of nicotine dependence: Secondary | ICD-10-CM | POA: Insufficient documentation

## 2023-05-24 DIAGNOSIS — Z7982 Long term (current) use of aspirin: Secondary | ICD-10-CM | POA: Insufficient documentation

## 2023-05-24 DIAGNOSIS — Z79899 Other long term (current) drug therapy: Secondary | ICD-10-CM | POA: Diagnosis not present

## 2023-05-24 DIAGNOSIS — C61 Malignant neoplasm of prostate: Secondary | ICD-10-CM | POA: Diagnosis not present

## 2023-05-24 HISTORY — PX: ROBOT ASSISTED LAPAROSCOPIC RADICAL PROSTATECTOMY: SHX5141

## 2023-05-24 HISTORY — PX: LYMPHADENECTOMY: SHX5960

## 2023-05-24 LAB — ABO/RH: ABO/RH(D): O POS

## 2023-05-24 LAB — HEMOGLOBIN AND HEMATOCRIT, BLOOD
HCT: 45.7 % (ref 39.0–52.0)
Hemoglobin: 15.3 g/dL (ref 13.0–17.0)

## 2023-05-24 SURGERY — XI ROBOTIC ASSISTED LAPAROSCOPIC RADICAL PROSTATECTOMY LEVEL 2
Anesthesia: General

## 2023-05-24 MED ORDER — LACTATED RINGERS IV SOLN: INTRAVENOUS | Status: DC | PRN

## 2023-05-24 MED ORDER — CEFAZOLIN SODIUM-DEXTROSE 1-4 GM/50ML-% IV SOLN
1.0000 g | Freq: Three times a day (TID) | INTRAVENOUS | Status: AC
Start: 2023-05-24 — End: 2023-05-25
  Administered 2023-05-24 (×2): 1 g via INTRAVENOUS
  Filled 2023-05-24 (×2): qty 50

## 2023-05-24 MED ORDER — ROCURONIUM BROMIDE 10 MG/ML (PF) SYRINGE
PREFILLED_SYRINGE | INTRAVENOUS | Status: AC
  Filled 2023-05-24: qty 10

## 2023-05-24 MED ORDER — HYDROMORPHONE HCL 1 MG/ML IJ SOLN
0.2500 mg | INTRAMUSCULAR | Status: DC | PRN
  Administered 2023-05-24: 0.5 mg via INTRAVENOUS

## 2023-05-24 MED ORDER — HYDROMORPHONE HCL 2 MG/ML IJ SOLN
INTRAMUSCULAR | Status: AC
  Filled 2023-05-24: qty 1

## 2023-05-24 MED ORDER — HYDROMORPHONE HCL 1 MG/ML IJ SOLN
INTRAMUSCULAR | Status: AC
  Administered 2023-05-24: 0.5 mg via INTRAVENOUS
  Filled 2023-05-24: qty 1

## 2023-05-24 MED ORDER — MORPHINE SULFATE (PF) 2 MG/ML IV SOLN
2.0000 mg | INTRAVENOUS | Status: DC | PRN
Start: 2023-05-24 — End: 2023-05-25
  Administered 2023-05-24: 2 mg via INTRAVENOUS
  Filled 2023-05-24 (×2): qty 1

## 2023-05-24 MED ORDER — TRAMADOL HCL 50 MG PO TABS
50.0000 mg | ORAL_TABLET | Freq: Four times a day (QID) | ORAL | 0 refills | Status: AC | PRN
  Filled 2023-05-24: qty 20, 4d supply, fill #0

## 2023-05-24 MED ORDER — DOCUSATE SODIUM 100 MG PO CAPS
100.0000 mg | ORAL_CAPSULE | Freq: Two times a day (BID) | ORAL | Status: DC
  Administered 2023-05-24 – 2023-05-25 (×3): 100 mg via ORAL
  Filled 2023-05-24 (×3): qty 1

## 2023-05-24 MED ORDER — ORAL CARE MOUTH RINSE: 15.0000 mL | Freq: Once | OROMUCOSAL | Status: AC

## 2023-05-24 MED ORDER — CEFAZOLIN SODIUM-DEXTROSE 2-3 GM-%(50ML) IV SOLR
INTRAVENOUS | Status: DC | PRN
  Administered 2023-05-24: 2 g via INTRAVENOUS

## 2023-05-24 MED ORDER — ROCURONIUM BROMIDE 10 MG/ML (PF) SYRINGE
PREFILLED_SYRINGE | INTRAVENOUS | Status: DC | PRN
  Administered 2023-05-24 (×3): 20 mg via INTRAVENOUS
  Administered 2023-05-24: 10 mg via INTRAVENOUS
  Administered 2023-05-24: 30 mg via INTRAVENOUS
  Administered 2023-05-24: 20 mg via INTRAVENOUS
  Administered 2023-05-24: 60 mg via INTRAVENOUS

## 2023-05-24 MED ORDER — EPHEDRINE SULFATE-NACL 50-0.9 MG/10ML-% IV SOSY
PREFILLED_SYRINGE | INTRAVENOUS | Status: DC | PRN
  Administered 2023-05-24 (×2): 5 mg via INTRAVENOUS
  Administered 2023-05-24: 10 mg via INTRAVENOUS

## 2023-05-24 MED ORDER — ROCURONIUM BROMIDE 10 MG/ML (PF) SYRINGE
PREFILLED_SYRINGE | INTRAVENOUS | Status: AC
  Filled 2023-05-24: qty 20

## 2023-05-24 MED ORDER — DEXMEDETOMIDINE HCL IN NACL 80 MCG/20ML IV SOLN
INTRAVENOUS | Status: AC
  Filled 2023-05-24: qty 20

## 2023-05-24 MED ORDER — LIDOCAINE 2% (20 MG/ML) 5 ML SYRINGE
INTRAMUSCULAR | Status: DC | PRN
  Administered 2023-05-24: 80 mg via INTRAVENOUS

## 2023-05-24 MED ORDER — ONDANSETRON HCL 4 MG/2ML IJ SOLN
4.0000 mg | INTRAMUSCULAR | Status: DC | PRN
  Filled 2023-05-24: qty 2

## 2023-05-24 MED ORDER — LIDOCAINE HCL (PF) 2 % IJ SOLN
INTRAMUSCULAR | Status: AC
  Filled 2023-05-24: qty 15

## 2023-05-24 MED ORDER — KETAMINE HCL 50 MG/5ML IJ SOSY
PREFILLED_SYRINGE | INTRAMUSCULAR | Status: AC
  Filled 2023-05-24: qty 5

## 2023-05-24 MED ORDER — SULFAMETHOXAZOLE-TRIMETHOPRIM 800-160 MG PO TABS
1.0000 | ORAL_TABLET | Freq: Two times a day (BID) | ORAL | 0 refills | Status: AC
  Filled 2023-05-24: qty 6, 3d supply, fill #0

## 2023-05-24 MED ORDER — PROPOFOL 10 MG/ML IV BOLUS
INTRAVENOUS | Status: AC
  Filled 2023-05-24: qty 20

## 2023-05-24 MED ORDER — DIPHENHYDRAMINE HCL 12.5 MG/5ML PO ELIX: 12.5000 mg | ORAL_SOLUTION | Freq: Four times a day (QID) | ORAL | Status: DC | PRN

## 2023-05-24 MED ORDER — BUPIVACAINE-EPINEPHRINE 0.25% -1:200000 IJ SOLN
INTRAMUSCULAR | Status: AC
  Filled 2023-05-24: qty 1

## 2023-05-24 MED ORDER — FENTANYL CITRATE (PF) 100 MCG/2ML IJ SOLN
INTRAMUSCULAR | Status: DC | PRN
  Administered 2023-05-24: 100 ug via INTRAVENOUS

## 2023-05-24 MED ORDER — DIPHENHYDRAMINE HCL 50 MG/ML IJ SOLN: 12.5000 mg | Freq: Four times a day (QID) | INTRAMUSCULAR | Status: DC | PRN

## 2023-05-24 MED ORDER — SUGAMMADEX SODIUM 200 MG/2ML IV SOLN
INTRAVENOUS | Status: DC | PRN
  Administered 2023-05-24: 200 mg via INTRAVENOUS

## 2023-05-24 MED ORDER — CHLORHEXIDINE GLUCONATE 0.12 % MT SOLN
15.0000 mL | Freq: Once | OROMUCOSAL | Status: AC
  Administered 2023-05-24: 15 mL via OROMUCOSAL

## 2023-05-24 MED ORDER — ONDANSETRON HCL 4 MG/2ML IJ SOLN
INTRAMUSCULAR | Status: DC | PRN
  Administered 2023-05-24: 4 mg via INTRAVENOUS

## 2023-05-24 MED ORDER — DOCUSATE SODIUM 100 MG PO CAPS: 100.0000 mg | ORAL_CAPSULE | Freq: Two times a day (BID) | ORAL | Status: AC

## 2023-05-24 MED ORDER — SODIUM CHLORIDE (PF) 0.9 % IJ SOLN
INTRAMUSCULAR | Status: AC
  Filled 2023-05-24: qty 10

## 2023-05-24 MED ORDER — HYDROMORPHONE HCL 1 MG/ML IJ SOLN
INTRAMUSCULAR | Status: DC | PRN
  Administered 2023-05-24: 1 mg via INTRAVENOUS

## 2023-05-24 MED ORDER — DEXAMETHASONE SODIUM PHOSPHATE 10 MG/ML IJ SOLN
INTRAMUSCULAR | Status: DC | PRN
  Administered 2023-05-24: 10 mg via INTRAVENOUS

## 2023-05-24 MED ORDER — EPHEDRINE 5 MG/ML INJ
INTRAVENOUS | Status: AC
  Filled 2023-05-24: qty 5

## 2023-05-24 MED ORDER — MIDAZOLAM HCL 5 MG/5ML IJ SOLN
INTRAMUSCULAR | Status: DC | PRN
  Administered 2023-05-24: 2 mg via INTRAVENOUS

## 2023-05-24 MED ORDER — STERILE WATER FOR IRRIGATION IR SOLN
Status: DC | PRN
  Administered 2023-05-24: 1000 mL

## 2023-05-24 MED ORDER — SODIUM CHLORIDE 0.9 % IV BOLUS
1000.0000 mL | Freq: Once | INTRAVENOUS | Status: AC
  Administered 2023-05-24: 1000 mL via INTRAVENOUS

## 2023-05-24 MED ORDER — DROPERIDOL 2.5 MG/ML IJ SOLN
INTRAMUSCULAR | Status: AC
  Filled 2023-05-24: qty 4

## 2023-05-24 MED ORDER — DROPERIDOL 2.5 MG/ML IJ SOLN
0.6250 mg | Freq: Once | INTRAMUSCULAR | Status: AC | PRN
  Administered 2023-05-24: 0.625 mg via INTRAVENOUS

## 2023-05-24 MED ORDER — ACETAMINOPHEN 325 MG PO TABS: 650.0000 mg | ORAL_TABLET | ORAL | Status: DC | PRN

## 2023-05-24 MED ORDER — DEXAMETHASONE SODIUM PHOSPHATE 10 MG/ML IJ SOLN
INTRAMUSCULAR | Status: AC
  Filled 2023-05-24: qty 3

## 2023-05-24 MED ORDER — ACETAMINOPHEN 500 MG PO TABS
1000.0000 mg | ORAL_TABLET | Freq: Once | ORAL | Status: AC
  Administered 2023-05-24: 1000 mg via ORAL
  Filled 2023-05-24: qty 2

## 2023-05-24 MED ORDER — KETAMINE HCL 10 MG/ML IJ SOLN
INTRAMUSCULAR | Status: DC | PRN
  Administered 2023-05-24: 20 mg via INTRAVENOUS
  Administered 2023-05-24: 30 mg via INTRAVENOUS

## 2023-05-24 MED ORDER — TRIPLE ANTIBIOTIC 3.5-400-5000 EX OINT: 1.0000 | TOPICAL_OINTMENT | Freq: Three times a day (TID) | CUTANEOUS | Status: DC | PRN

## 2023-05-24 MED ORDER — CEFAZOLIN SODIUM-DEXTROSE 2-4 GM/100ML-% IV SOLN
INTRAVENOUS | Status: AC
  Filled 2023-05-24: qty 100

## 2023-05-24 MED ORDER — HYOSCYAMINE SULFATE 0.125 MG SL SUBL: 0.1250 mg | SUBLINGUAL_TABLET | Freq: Four times a day (QID) | SUBLINGUAL | Status: DC | PRN

## 2023-05-24 MED ORDER — BUPIVACAINE-EPINEPHRINE 0.25% -1:200000 IJ SOLN
INTRAMUSCULAR | Status: DC | PRN
  Administered 2023-05-24: 30 mL

## 2023-05-24 MED ORDER — ZOLPIDEM TARTRATE 5 MG PO TABS: 5.0000 mg | ORAL_TABLET | Freq: Every evening | ORAL | Status: DC | PRN

## 2023-05-24 MED ORDER — MIDAZOLAM HCL 2 MG/2ML IJ SOLN
INTRAMUSCULAR | Status: AC
  Filled 2023-05-24: qty 2

## 2023-05-24 MED ORDER — KETOROLAC TROMETHAMINE 15 MG/ML IJ SOLN
15.0000 mg | Freq: Four times a day (QID) | INTRAMUSCULAR | Status: DC
  Administered 2023-05-24 – 2023-05-25 (×4): 15 mg via INTRAVENOUS
  Filled 2023-05-24 (×3): qty 1

## 2023-05-24 MED ORDER — ONDANSETRON HCL 4 MG/2ML IJ SOLN
INTRAMUSCULAR | Status: AC
Start: 2023-05-24 — End: ?
  Filled 2023-05-24: qty 6

## 2023-05-24 MED ORDER — FENTANYL CITRATE (PF) 100 MCG/2ML IJ SOLN
INTRAMUSCULAR | Status: AC
  Filled 2023-05-24: qty 2

## 2023-05-24 MED ORDER — PROPOFOL 10 MG/ML IV BOLUS
INTRAVENOUS | Status: DC | PRN
  Administered 2023-05-24: 150 mg via INTRAVENOUS

## 2023-05-24 MED ORDER — KCL IN DEXTROSE-NACL 20-5-0.45 MEQ/L-%-% IV SOLN
INTRAVENOUS | Status: DC
  Filled 2023-05-24 (×3): qty 1000

## 2023-05-24 MED ORDER — KETOROLAC TROMETHAMINE 15 MG/ML IJ SOLN
INTRAMUSCULAR | Status: AC
  Filled 2023-05-24: qty 1

## 2023-05-24 SURGICAL SUPPLY — 58 items
APPLICATOR COTTON TIP 6 STRL (MISCELLANEOUS) ×2 IMPLANT
APPLICATOR COTTON TIP 6IN STRL (MISCELLANEOUS) ×2 IMPLANT
BAG COUNTER SPONGE SURGICOUNT (BAG) IMPLANT
CATH FOLEY 2WAY SLVR 18FR 30CC (CATHETERS) ×2 IMPLANT
CATH ROBINSON RED A/P 16FR (CATHETERS) ×2 IMPLANT
CATH ROBINSON RED A/P 8FR (CATHETERS) ×2 IMPLANT
CATH TIEMANN FOLEY 18FR 5CC (CATHETERS) ×2 IMPLANT
CHLORAPREP W/TINT 26 (MISCELLANEOUS) ×2 IMPLANT
CLIP LIGATING HEM O LOK PURPLE (MISCELLANEOUS) ×2 IMPLANT
COVER SURGICAL LIGHT HANDLE (MISCELLANEOUS) ×2 IMPLANT
COVER TIP SHEARS 8 DVNC (MISCELLANEOUS) ×2 IMPLANT
CUTTER ECHEON FLEX ENDO 45 340 (ENDOMECHANICALS) ×2 IMPLANT
DERMABOND ADVANCED .7 DNX12 (GAUZE/BANDAGES/DRESSINGS) ×2 IMPLANT
DRAIN CHANNEL RND F F (WOUND CARE) IMPLANT
DRAPE ARM DVNC X/XI (DISPOSABLE) ×8 IMPLANT
DRAPE COLUMN DVNC XI (DISPOSABLE) ×2 IMPLANT
DRAPE SURG IRRIG POUCH 19X23 (DRAPES) ×2 IMPLANT
DRIVER NDL LRG 8 DVNC XI (INSTRUMENTS) ×4 IMPLANT
DRIVER NDLE LRG 8 DVNC XI (INSTRUMENTS) ×4 IMPLANT
DRSG TEGADERM 4X4.75 (GAUZE/BANDAGES/DRESSINGS) ×2 IMPLANT
ELECT PENCIL ROCKER SW 15FT (MISCELLANEOUS) ×2 IMPLANT
ELECT REM PT RETURN 15FT ADLT (MISCELLANEOUS) ×2 IMPLANT
FORCEPS BPLR LNG DVNC XI (INSTRUMENTS) ×2 IMPLANT
FORCEPS PROGRASP DVNC XI (FORCEP) ×2 IMPLANT
GAUZE SPONGE 4X4 12PLY STRL (GAUZE/BANDAGES/DRESSINGS) ×2 IMPLANT
GLOVE BIO SURGEON STRL SZ 6.5 (GLOVE) ×2 IMPLANT
GLOVE SURG LX STRL 7.5 STRW (GLOVE) ×4 IMPLANT
GOWN STRL REUS W/ TWL XL LVL3 (GOWN DISPOSABLE) ×4 IMPLANT
GOWN STRL SURGICAL XL XLNG (GOWN DISPOSABLE) ×2 IMPLANT
HOLDER FOLEY CATH W/STRAP (MISCELLANEOUS) ×2 IMPLANT
IRRIG SUCT STRYKERFLOW 2 WTIP (MISCELLANEOUS) ×2
IRRIGATION SUCT STRKRFLW 2 WTP (MISCELLANEOUS) ×2 IMPLANT
IV LACTATED RINGERS 1000ML (IV SOLUTION) ×2 IMPLANT
KIT TURNOVER KIT A (KITS) IMPLANT
NDL SAFETY ECLIPSE 18X1.5 (NEEDLE) IMPLANT
PACK ROBOT UROLOGY CUSTOM (CUSTOM PROCEDURE TRAY) ×2 IMPLANT
PLUG CATH AND CAP STRL 200 (CATHETERS) ×2 IMPLANT
RELOAD STAPLE 45 4.1 GRN THCK (STAPLE) ×2 IMPLANT
SCISSORS MNPLR CVD DVNC XI (INSTRUMENTS) ×2 IMPLANT
SEAL UNIV 5-12 XI (MISCELLANEOUS) ×8 IMPLANT
SET CYSTO W/LG BORE CLAMP LF (SET/KITS/TRAYS/PACK) IMPLANT
SET TUBE SMOKE EVAC HIGH FLOW (TUBING) ×2 IMPLANT
SOL ELECTROSURG ANTI STICK (MISCELLANEOUS) ×2
SOL PREP POV-IOD 4OZ 10% (MISCELLANEOUS) ×2 IMPLANT
SOLUTION ELECTROSURG ANTI STCK (MISCELLANEOUS) ×2 IMPLANT
SPIKE FLUID TRANSFER (MISCELLANEOUS) ×2 IMPLANT
STAPLE RELOAD 45 GRN (STAPLE) ×2 IMPLANT
SUT ETHILON 3 0 PS 1 (SUTURE) ×2 IMPLANT
SUT MNCRL 3 0 RB1 (SUTURE) ×2 IMPLANT
SUT MNCRL 3 0 VIOLET RB1 (SUTURE) ×2 IMPLANT
SUT MNCRL AB 4-0 PS2 18 (SUTURE) ×4 IMPLANT
SUT PDS PLUS AB 0 CT-2 (SUTURE) ×4 IMPLANT
SUT VIC AB 0 CT1 27XBRD ANTBC (SUTURE) ×4 IMPLANT
SUT VIC AB 2-0 SH 27X BRD (SUTURE) ×2 IMPLANT
SUT VIC AB 3-0 SH 27X BRD (SUTURE) IMPLANT
SYR 27GX1/2 1ML LL SAFETY (SYRINGE) ×2 IMPLANT
TROCAR Z THREAD OPTICAL 12X100 (TROCAR) IMPLANT
WATER STERILE IRR 1000ML POUR (IV SOLUTION) ×2 IMPLANT

## 2023-05-24 NOTE — Progress Notes (Signed)
Post-op note  Subjective: The patient is doing well.  No complaints.   Leakage around JP site. NO evidence of active bleeding. JP reinforced.   Objective: Vital signs in last 24 hours: Temp:  [97.6 F (36.4 C)-97.7 F (36.5 C)] 97.6 F (36.4 C) (01/20 1325) Pulse Rate:  [47-70] 60 (01/20 1325) Resp:  [9-18] 18 (01/20 1325) BP: (121-146)/(59-91) 137/77 (01/20 1325) SpO2:  [95 %-100 %] 99 % (01/20 1325) Weight:  [69.4 kg] 69.4 kg (01/20 0536)  Intake/Output from previous day: No intake/output data recorded. Intake/Output this shift: Total I/O In: 1000 [I.V.:1000] Out: 230 [Drains:30; Blood:200]  Physical Exam:  General: Alert and oriented. Abdomen: Soft, Nondistended. Incisions: Clean and dry. Jp with serosang output.  GU: Foley with clear yellow urine.   Lab Results: Recent Labs    05/24/23 1136  HGB 15.3  HCT 45.7    Assessment/Plan: POD#0   1) Continue to monitor  Jerald Kief, MD, PhD [facility] Resident  PGY4 [facility]    LOS: 0 days   Terry Jenkins 05/24/2023, 3:38 PM

## 2023-05-24 NOTE — Discharge Instructions (Signed)

## 2023-05-24 NOTE — Anesthesia Procedure Notes (Signed)
Procedure Name: Intubation Date/Time: 05/24/2023 7:46 AM  Performed by: Theodosia Quay, CRNAPre-anesthesia Checklist: Patient identified, Emergency Drugs available, Suction available, Patient being monitored and Timeout performed Patient Re-evaluated:Patient Re-evaluated prior to induction Oxygen Delivery Method: Circle system utilized Preoxygenation: Pre-oxygenation with 100% oxygen Induction Type: IV induction Ventilation: Mask ventilation without difficulty Laryngoscope Size: Mac and 4 Grade View: Grade I Tube type: Oral Tube size: 7.0 mm Number of attempts: 1 Airway Equipment and Method: Stylet Placement Confirmation: ETT inserted through vocal cords under direct vision, positive ETCO2, CO2 detector and breath sounds checked- equal and bilateral Secured at: 23 cm Tube secured with: Tape Dental Injury: Teeth and Oropharynx as per pre-operative assessment  Comments: ATOI

## 2023-05-24 NOTE — Transfer of Care (Signed)
Immediate Anesthesia Transfer of Care Note  Patient: Terry Jenkins  Procedure(s) Performed: Procedure(s) with comments: XI ROBOTIC ASSISTED LAPAROSCOPIC RADICAL PROSTATECTOMY LEVEL 2 (N/A) - 210 MINUTES NEEDED FOR CASE BILATERAL PELVIC LYMPHADENECTOMY (Bilateral)  Patient Location: PACU  Anesthesia Type:General  Level of Consciousness:  sedated, patient cooperative and responds to stimulation  Airway & Oxygen Therapy:Patient Spontanous Breathing and Patient connected to face mask oxgen  Post-op Assessment:  Report given to PACU RN and Post -op Vital signs reviewed and stable  Post vital signs:  Reviewed and stable  Last Vitals:  Vitals:   05/24/23 0536  BP: 121/80  Pulse: (!) 47  Resp: 16  Temp: 36.5 C  SpO2: 100%    Complications: No apparent anesthesia complications

## 2023-05-24 NOTE — Anesthesia Postprocedure Evaluation (Signed)
Anesthesia Post Note  Patient: Terry Jenkins  Procedure(s) Performed: XI ROBOTIC ASSISTED LAPAROSCOPIC RADICAL PROSTATECTOMY LEVEL 2 BILATERAL PELVIC LYMPHADENECTOMY (Bilateral)     Patient location during evaluation: PACU Anesthesia Type: General Level of consciousness: awake and alert Pain management: pain level controlled Vital Signs Assessment: post-procedure vital signs reviewed and stable Respiratory status: spontaneous breathing, nonlabored ventilation and respiratory function stable Cardiovascular status: blood pressure returned to baseline Postop Assessment: no apparent nausea or vomiting Anesthetic complications: no   No notable events documented.         Shanda Howells

## 2023-05-24 NOTE — Op Note (Signed)
Preoperative diagnosis: Clinically localized adenocarcinoma of the prostate (clinical stage T1c Nx Mx)  Postoperative diagnosis: Clinically localized adenocarcinoma of the prostate (clinical stage T1c Nx Mx)  Procedure:  Robotic assisted laparoscopic radical prostatectomy (bilateral nerve sparing) Bilateral robotic assisted laparoscopic pelvic lymphadenectomy  Surgeon: Moody Bruins. M.D.  Assistant: Harrie Foreman, PA-C  An assistant was required for this surgical procedure.  The duties of the assistant included but were not limited to suctioning, passing suture, camera manipulation, retraction. This procedure would not be able to be performed without an Geophysicist/field seismologist.  Resident: Dr. Jerald Kief  Anesthesia: General  Complications: None  EBL: 150 mL  IVF:  1000 mL crystalloid  Specimens: Prostate and seminal vesicles Right pelvic lymph nodes Left pelvic lymph nodes  Disposition of specimens: Pathology  Drains: 20 Fr coude catheter # 19 Blake pelvic drain  Indication: Terry Jenkins is a 53 y.o. year old patient with clinically localized prostate cancer.  After a thorough review of the management options for treatment of prostate cancer, he elected to proceed with surgical therapy and the above procedure(s).  We have discussed the potential benefits and risks of the procedure, side effects of the proposed treatment, the likelihood of the patient achieving the goals of the procedure, and any potential problems that might occur during the procedure or recuperation. Informed consent has been obtained.  Description of procedure:  The patient was taken to the operating room and a general anesthetic was administered. He was given preoperative antibiotics, placed in the dorsal lithotomy position, and prepped and draped in the usual sterile fashion. Next a preoperative timeout was performed. A urethral catheter was placed into the bladder and a site was selected near the  umbilicus for placement of the camera port. This was placed using a standard open Hassan technique which allowed entry into the peritoneal cavity under direct vision and without difficulty. An 8 mm robotic port was placed and a pneumoperitoneum established. The camera was then used to inspect the abdomen and there was no evidence of any intra-abdominal injuries or other abnormalities. The remaining abdominal ports were then placed. 8 mm robotic ports were placed in the right lower quadrant, left lower quadrant, and far left lateral abdominal wall. A 5 mm port was placed in the right upper quadrant and a 12 mm port was placed in the right lateral abdominal wall for laparoscopic assistance. All ports were placed under direct vision without difficulty. The surgical cart was then docked.   Utilizing the cautery scissors, the bladder was reflected posteriorly allowing entry into the space of Retzius and identification of the endopelvic fascia and prostate. The periprostatic fat was then removed from the prostate allowing full exposure of the endopelvic fascia. The endopelvic fascia was then incised from the apex back to the base of the prostate bilaterally and the underlying levator muscle fibers were swept laterally off the prostate thereby isolating the dorsal venous complex. There was an accessory left obturator artery that was preserved. The dorsal vein was then stapled and divided with a 45 mm Flex Echelon stapler. Attention then turned to the bladder neck which was divided anteriorly thereby allowing entry into the bladder and exposure of the urethral catheter. The catheter balloon was deflated and the catheter was brought into the operative field and used to retract the prostate anteriorly. The posterior bladder neck was then examined and was divided allowing further dissection between the bladder and prostate posteriorly until the vasa deferentia and seminal vessels were identified. The  vasa deferentia were  isolated, divided, and lifted anteriorly. The seminal vesicles were dissected down to their tips with care to control the seminal vascular arterial blood supply. These structures were then lifted anteriorly and the space between Denonvillier's fascia and the anterior rectum was developed with a combination of sharp and blunt dissection. This isolated the vascular pedicles of the prostate.  The lateral prostatic fascia was then sharply incised allowing release of the neurovascular bundles bilaterally. The vascular pedicles of the prostate were then ligated with Weck clips between the prostate and neurovascular bundles and divided with sharp cold scissor dissection resulting in neurovascular bundle preservation. The neurovascular bundles were then separated off the apex of the prostate and urethra bilaterally.  The urethra was then sharply transected allowing the prostate specimen to be disarticulated. The pelvis was copiously irrigated and hemostasis was ensured. There was no evidence for rectal injury.  Attention then turned to the right pelvic sidewall. The fibrofatty tissue between the external iliac vein, confluence of the iliac vessels, hypogastric artery, and Cooper's ligament was dissected free from the pelvic sidewall with care to preserve the obturator nerve. Weck clips were used for lymphostasis and hemostasis. An identical procedure was performed on the contralateral side and the lymphatic packets were removed for permanent pathologic analysis.  Attention then turned to the urethral anastomosis. A 2-0 Vicryl slip knot was placed between Denonvillier's fascia, the posterior bladder neck, and the posterior urethra to reapproximate these structures. A double-armed 3-0 Monocryl suture was then used to perform a 360 running tension-free anastomosis between the bladder neck and urethra. A new urethral catheter was then placed into the bladder and irrigated. There were no blood clots within the bladder  and the anastomosis appeared to be watertight. A #19 Blake drain was then brought through the left lateral 8 mm port site and positioned appropriately within the pelvis. It was secured to the skin with a nylon suture. The surgical cart was then undocked. The right lateral 12 mm port site was closed at the fascial level with a 0 Vicryl suture placed laparoscopically. All remaining ports were then removed under direct vision. The prostate specimen was removed intact within the Endopouch retrieval bag via the periumbilical camera port site. This fascial opening was closed with two running 0 PDS sutures. 0.25% Marcaine was then injected into all port sites and all incisions were reapproximated at the skin level with 4-0 Monocryl subcuticular sutures and Dermabond. The patient appeared to tolerate the procedure well and without complications. The patient was able to be extubated and transferred to the recovery unit in satisfactory condition.   Moody Bruins MD

## 2023-05-24 NOTE — Interval H&P Note (Signed)
History and Physical Interval Note:  05/24/2023 6:55 AM  Terry Jenkins  has presented today for surgery, with the diagnosis of PROSTATE CANCER.  The various methods of treatment have been discussed with the patient and family. After consideration of risks, benefits and other options for treatment, the patient has consented to  Procedure(s) with comments: XI ROBOTIC ASSISTED LAPAROSCOPIC RADICAL PROSTATECTOMY LEVEL 2 (N/A) - 210 MINUTES NEEDED FOR CASE BILATERAL PELVIC LYMPHADENECTOMY (Bilateral) as a surgical intervention.  The patient's history has been reviewed, patient examined, no change in status, stable for surgery.  I have reviewed the patient's chart and labs.  Questions were answered to the patient's satisfaction.     [employer]

## 2023-05-25 ENCOUNTER — Other Ambulatory Visit (HOSPITAL_COMMUNITY): Payer: Self-pay

## 2023-05-25 ENCOUNTER — Encounter (HOSPITAL_COMMUNITY): Payer: Self-pay | Admitting: Urology

## 2023-05-25 DIAGNOSIS — Z7982 Long term (current) use of aspirin: Secondary | ICD-10-CM | POA: Diagnosis not present

## 2023-05-25 DIAGNOSIS — Z87891 Personal history of nicotine dependence: Secondary | ICD-10-CM | POA: Diagnosis not present

## 2023-05-25 DIAGNOSIS — Z79899 Other long term (current) drug therapy: Secondary | ICD-10-CM | POA: Diagnosis not present

## 2023-05-25 DIAGNOSIS — C61 Malignant neoplasm of prostate: Secondary | ICD-10-CM | POA: Diagnosis not present

## 2023-05-25 LAB — HEMOGLOBIN AND HEMATOCRIT, BLOOD
HCT: 36.8 % — ABNORMAL LOW (ref 39.0–52.0)
HCT: 37.9 % — ABNORMAL LOW (ref 39.0–52.0)
Hemoglobin: 12.3 g/dL — ABNORMAL LOW (ref 13.0–17.0)
Hemoglobin: 12.5 g/dL — ABNORMAL LOW (ref 13.0–17.0)

## 2023-05-25 MED ORDER — TRAMADOL HCL 50 MG PO TABS
50.0000 mg | ORAL_TABLET | Freq: Four times a day (QID) | ORAL | Status: DC | PRN
  Administered 2023-05-25: 50 mg via ORAL
  Filled 2023-05-25: qty 1

## 2023-05-25 MED ORDER — BISACODYL 10 MG RE SUPP
10.0000 mg | Freq: Once | RECTAL | Status: AC
  Administered 2023-05-25: 10 mg via RECTAL
  Filled 2023-05-25: qty 1

## 2023-05-25 NOTE — Progress Notes (Signed)
TOC  discharge meds in  a secure bag delivered to pt in car by this RN

## 2023-05-25 NOTE — Discharge Summary (Signed)
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 Briefly, Terry Jenkins is a 53 y.o. year old patient with prostate cancer who underwent radical prostatectomy with bilateral lymph node dissection.   Hospital Course: Patient tolerated the procedure well.  He was then transferred to the floor after an uneventful PACU stay.  His hospital course was uncomplicated.  On POD#1 he had met discharge criteria: was eating a regular diet, was up and ambulating independently,  pain was well controlled, was voiding without a catheter, and was ready to for discharge.   Laboratory values:  Recent Labs    05/24/23 1136 05/25/23 0510 05/25/23 1030  HGB 15.3 12.3* 12.5*  HCT 45.7 36.8* 37.9*   No results for input(s): "NA", "K", "CL", "CO2", "GLUCOSE", "BUN", "CREATININE", "CALCIUM" in the last 72 hours. No results for input(s): "LABPT", "INR" in the last 72 hours. No results for input(s): "LABURIN" in the last 72 hours. Results for orders placed or performed in visit on 08/19/21  Urine Culture     Status: None   Collection Time: 08/19/21 12:00 AM    Specimen: Urine  Result Value Ref Range Status   MICRO NUMBER: [id number]  Final   SPECIMEN QUALITY: Adequate  Final   Sample Source URINE  Final   STATUS: FINAL  Final   Result: No Growth  Final    Physical Exam  Gen: NAD Resp: Satting well on RA Card: Regular rate Abd: Soft, appropriately tender, ND, incision clean dry and intact GU:  Foley catheter in place draining urine. Neuro: Alert   Disposition: Home  Discharge instruction: The patient was instructed to be ambulatory but told to refrain from heavy lifting, strenuous activity, or driving.   Discharge medications:  Allergies as of 05/25/2023       Reactions   Ampicillin Rash   Childhood Reaction         Medication List     STOP taking these medications    Fish Oil 1000 MG Caps   multivitamin tablet   niacin 500 MG tablet Commonly known as: VITAMIN B3       TAKE these medications    docusate sodium 100 MG capsule Commonly known as: COLACE Take 1 capsule (100 mg total) by mouth 2 (two) times daily.   sulfamethoxazole-trimethoprim 800-160 MG tablet Commonly known as: BACTRIM DS Take 1 tablet by mouth 2 (two) times daily. Start the day prior to foley removal appointment   tadalafil 5 MG tablet Commonly known as: CIALIS Take 5 mg by mouth daily.   traMADol 50 MG tablet Commonly known as: Ultram Take 1-2 tablets (50-100 mg total) by mouth every 6 (six) hours as needed for moderate pain (pain score 4-6) or  severe pain (pain score 7-10).        Followup:   Follow-up Information     Heloise Purpura, MD Follow up on 06/01/2023.   Specialty: Urology Why: at 10:45 Contact information: [address] [place] [address] [phone]

## 2023-05-25 NOTE — Progress Notes (Signed)
Patient ID: SAMUELE SUTCLIFFE, male   DOB: Apr 21, 1971, 53 y.o.   MRN: [id number]  1 Day Post-Op Subjective: The patient is doing well.  No nausea or vomiting. Pain is adequately controlled.  Objective: Vital signs in last 24 hours: Temp:  [97.6 F (36.4 C)-98.4 F (36.9 C)] 98.4 F (36.9 C) (01/21 0536) Pulse Rate:  [54-79] 54 (01/21 0536) Resp:  [9-18] 12 (01/21 0536) BP: (109-146)/(59-91) 111/64 (01/21 0536) SpO2:  [95 %-100 %] 98 % (01/21 0536)  Intake/Output from previous day: 01/20 0701 - 01/21 0700 In: 3864.9 [P.O.:830; I.V.:2934.9; IV Piggyback:100] Out: 4700 [Urine:4450; Drains:50; Blood:200] Intake/Output this shift: No intake/output data recorded.  Physical Exam:  General: Alert and oriented. CV: RRR Lungs: Clear bilaterally. GI: Soft, Nondistended. Incisions: Clean, dry, and intact Urine: Clear Extremities: Nontender, no erythema, no edema.  Lab Results: Recent Labs    05/24/23 1136 05/25/23 0510  HGB 15.3 12.3*  HCT 45.7 36.8*      Assessment/Plan: POD# 1 s/p robotic prostatectomy.  1) SL IVF 2) Ambulate, Incentive spirometry 3) Transition to oral pain medication 4) Dulcolax suppository 5) D/C pelvic drain 6) Plan for likely discharge later today   Moody Bruins. MD   LOS: 0 days   Crecencio Mc 05/25/2023, 7:51 AM

## 2023-05-25 NOTE — Progress Notes (Signed)
   05/25/23 1022  TOC Brief Assessment  Insurance and Status Reviewed  Patient has primary care physician Yes  Home environment has been reviewed Resides alone in single family home  Prior level of function: Independent with ADLs at baseline  Prior/Current Home Services No current home services  Social Drivers of Health Review SDOH reviewed no interventions necessary  Readmission risk has been reviewed Yes  Transition of care needs no transition of care needs at this time

## 2023-05-27 LAB — SURGICAL PATHOLOGY

## 2023-05-31 IMAGING — DX DG ANKLE COMPLETE 3+V*L*
3 series · 3 of 3 positions shown · non-contrast
Comparison: 08/23/2019

CLINICAL DATA: Lateral pain after twisting injury

EXAM:
LEFT ANKLE COMPLETE - 3+ VIEW

[ankle ap]
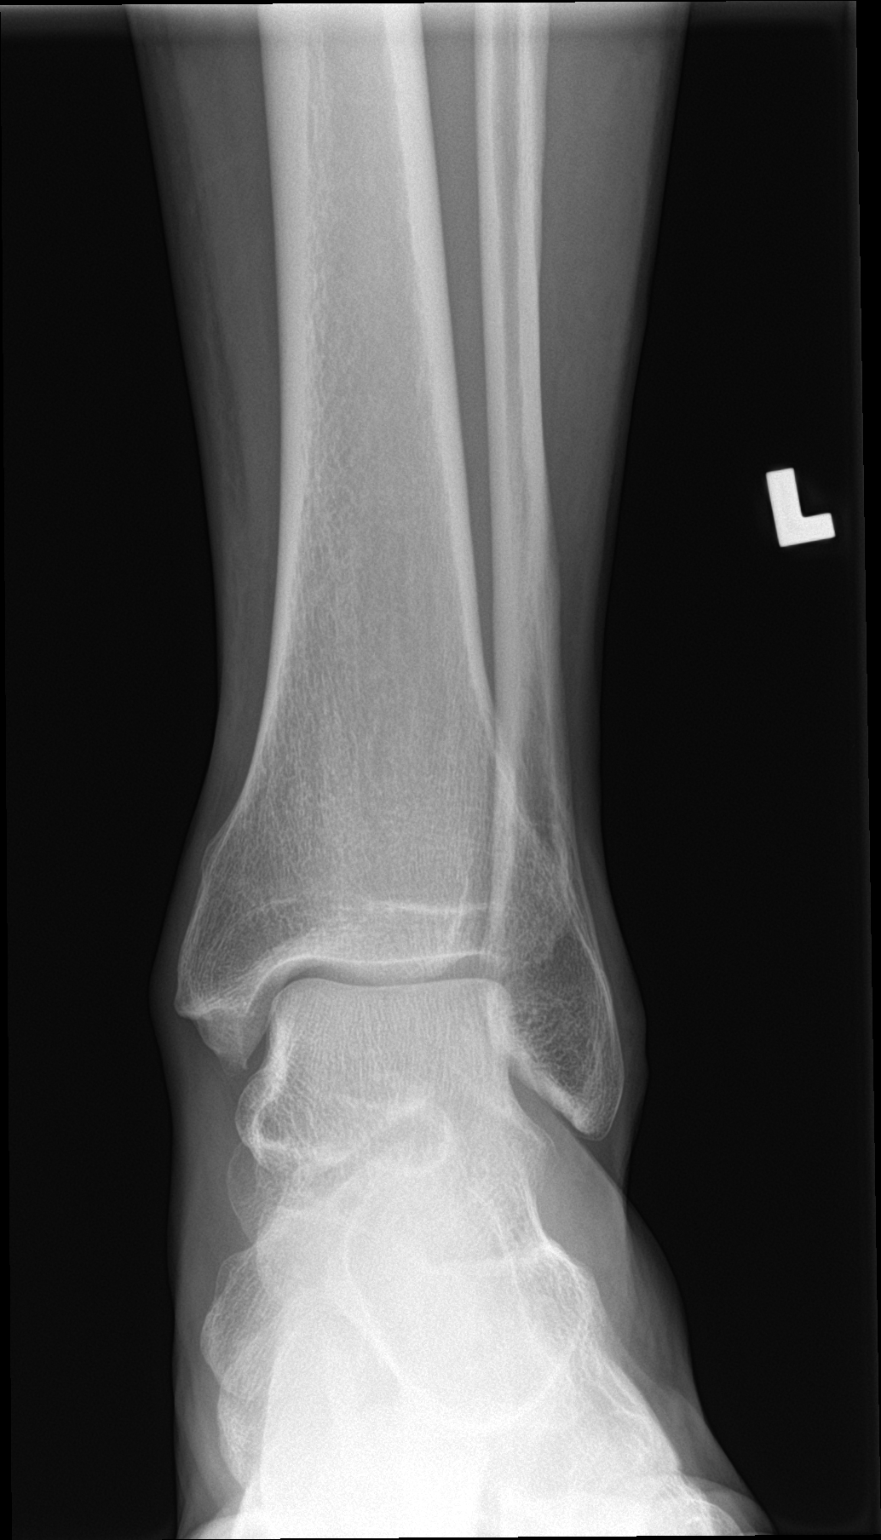

[ankle obl]
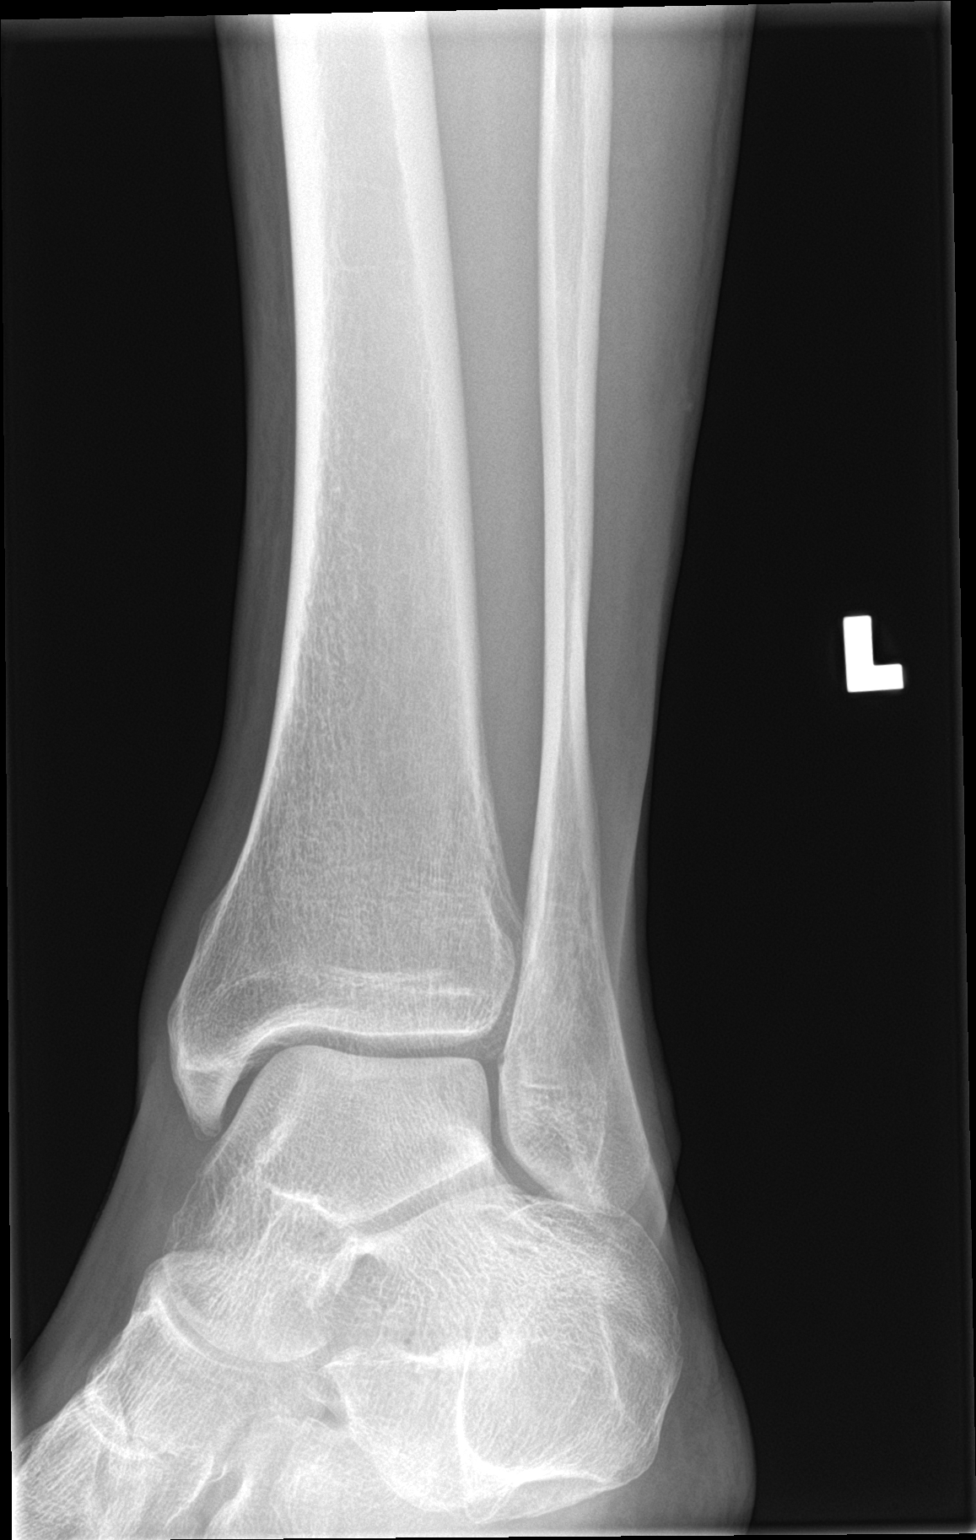

[ankle lat]
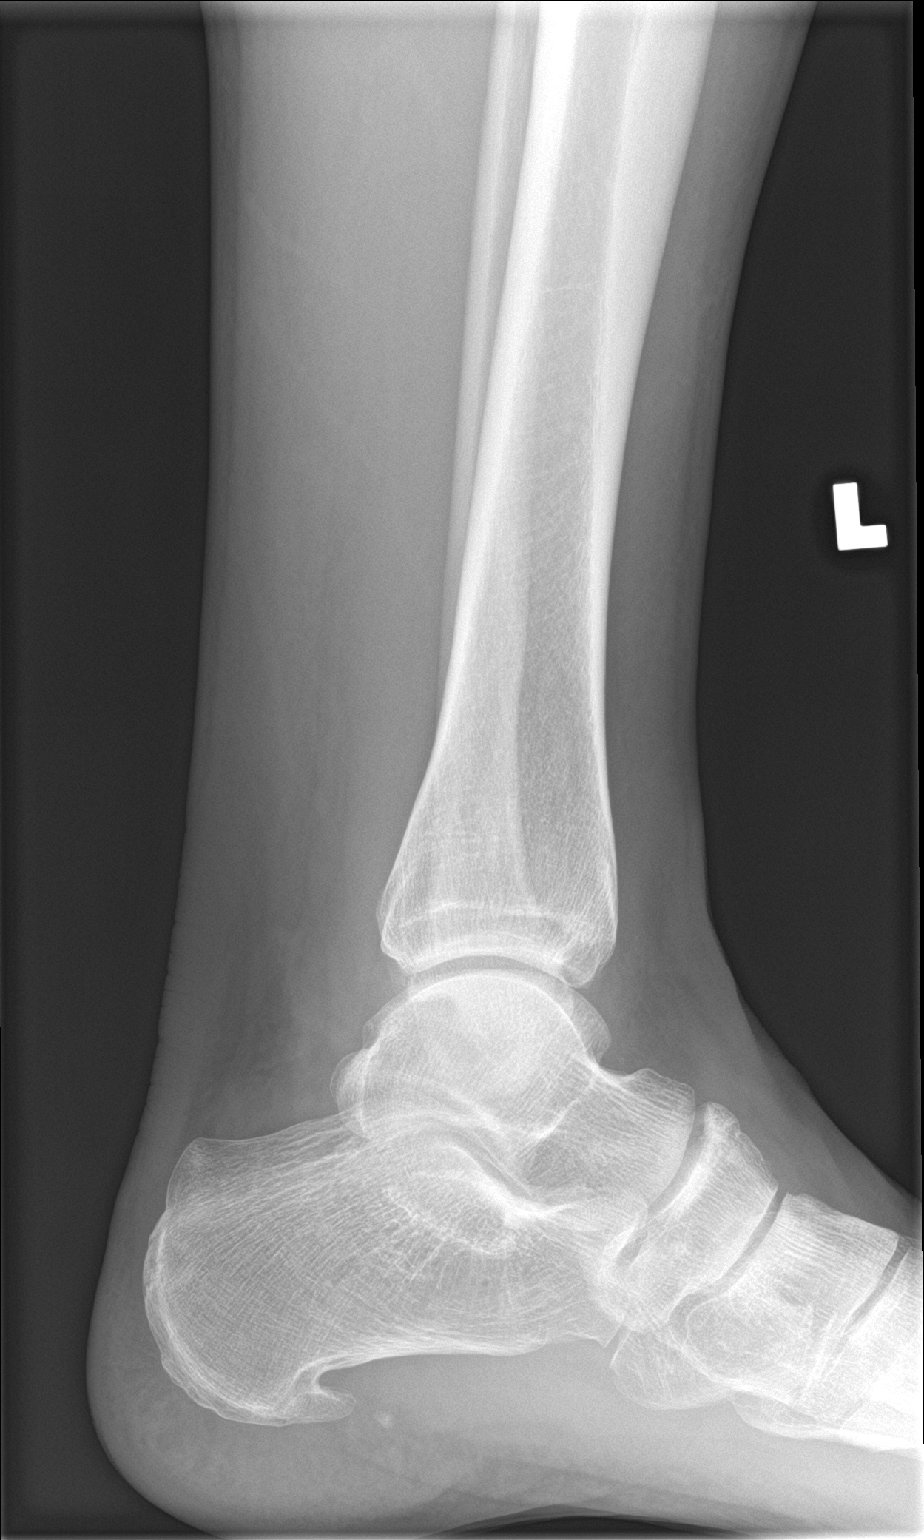

[3 of 3 positions shown; findings below may reference images not displayed]

FINDINGS: No acute fracture or dislocation. Talar dome intact. Small calcaneal
spur.
IMPRESSION: No acute osseous abnormality.

## 2023-06-09 DIAGNOSIS — F4320 Adjustment disorder, unspecified: Secondary | ICD-10-CM | POA: Diagnosis not present

## 2023-06-14 DIAGNOSIS — L578 Other skin changes due to chronic exposure to nonionizing radiation: Secondary | ICD-10-CM | POA: Diagnosis not present

## 2023-06-14 DIAGNOSIS — A630 Anogenital (venereal) warts: Secondary | ICD-10-CM | POA: Diagnosis not present

## 2023-06-14 DIAGNOSIS — D1801 Hemangioma of skin and subcutaneous tissue: Secondary | ICD-10-CM | POA: Diagnosis not present

## 2023-06-14 DIAGNOSIS — L820 Inflamed seborrheic keratosis: Secondary | ICD-10-CM | POA: Diagnosis not present

## 2023-06-14 DIAGNOSIS — L821 Other seborrheic keratosis: Secondary | ICD-10-CM | POA: Diagnosis not present

## 2023-06-14 DIAGNOSIS — L814 Other melanin hyperpigmentation: Secondary | ICD-10-CM | POA: Diagnosis not present

## 2023-06-21 DIAGNOSIS — M62838 Other muscle spasm: Secondary | ICD-10-CM | POA: Diagnosis not present

## 2023-06-21 DIAGNOSIS — N393 Stress incontinence (female) (male): Secondary | ICD-10-CM | POA: Diagnosis not present

## 2023-06-21 DIAGNOSIS — M6281 Muscle weakness (generalized): Secondary | ICD-10-CM | POA: Diagnosis not present

## 2023-06-24 ENCOUNTER — Other Ambulatory Visit (HOSPITAL_COMMUNITY): Payer: Self-pay

## 2023-06-30 DIAGNOSIS — F4320 Adjustment disorder, unspecified: Secondary | ICD-10-CM | POA: Diagnosis not present

## 2023-07-02 DIAGNOSIS — M62838 Other muscle spasm: Secondary | ICD-10-CM | POA: Diagnosis not present

## 2023-07-02 DIAGNOSIS — N393 Stress incontinence (female) (male): Secondary | ICD-10-CM | POA: Diagnosis not present

## 2023-07-02 DIAGNOSIS — M6281 Muscle weakness (generalized): Secondary | ICD-10-CM | POA: Diagnosis not present

## 2023-07-30 ENCOUNTER — Ambulatory Visit
Admission: EM | Admit: 2023-07-30 | Discharge: 2023-07-30 | Disposition: A | Discharge status: Home / Self Care | Attending: Physician Assistant | Admitting: Physician Assistant

## 2023-07-30 ENCOUNTER — Encounter: Payer: Self-pay | Admitting: Emergency Medicine

## 2023-07-30 DIAGNOSIS — S61210A Laceration without foreign body of right index finger without damage to nail, initial encounter: Secondary | ICD-10-CM

## 2023-07-30 NOTE — ED Provider Notes (Signed)
 [facility]    CSN: [id number] Arrival date & time: 07/30/23  1707      History   Chief Complaint Chief Complaint  Patient presents with   Laceration    HPI JABRI BLANCETT is a 53 y.o. male.   HPI  Patient presents today with laceration to the right index finger.  He reports that he was slicing vegetables with a mandolin and cut the index finger on the blade. He reports mild pain and wound is actively bleeding at check-in. Most recent Tdap was 09/18/2022.    Past Medical History:  Diagnosis Date   Anxiety    Arthritis    Thumbs   Family history of adverse reaction to anesthesia    Mother does not react well   Hypercholesteremia    Marijuana smoker in remission [facility])    Prostate cancer (HCC)    Seasonal allergies    Varicocele     Patient Active Problem List   Diagnosis Date Noted   Prostate cancer (HCC) 05/24/2023   Zoster 02/18/2023   Insomnia 02/18/2023   Gleason grade group 1 adenocarcinoma of prostate (HCC) 10/17/2021   Prostate cancer managed with active surveillance (HCC) 10/14/2021   PSA elevation 08/19/2021   Arthritis 08/14/2021   Well adult exam 07/27/2021   Snapping hip, left 07/11/2021   Low testosterone 06/11/2021   Varicocele 07/22/2016   Nocturia 07/08/2016   Hypogonadism in male 07/08/2016   Ischial bursitis of right side 07/04/2013    Past Surgical History:  Procedure Laterality Date   ANAL FISSURE REPAIR     HERNIA REPAIR     LYMPHADENECTOMY Bilateral 05/24/2023   Procedure: BILATERAL PELVIC LYMPHADENECTOMY;  Surgeon: Heloise Purpura, MD;  Location: [facility];  Service: Urology;  Laterality: Bilateral;   ROBOT ASSISTED LAPAROSCOPIC RADICAL PROSTATECTOMY N/A 05/24/2023   Procedure: XI ROBOTIC ASSISTED LAPAROSCOPIC RADICAL PROSTATECTOMY LEVEL 2;  Surgeon: Heloise Purpura, MD;  Location: [facility];  Service: Urology;  Laterality: N/A;  210 MINUTES NEEDED FOR CASE       Home Medications    Prior to Admission medications    Medication Sig Start Date End Date Taking? Authorizing Provider  docusate sodium (COLACE) 100 MG capsule Take 1 capsule (100 mg total) by mouth 2 (two) times daily. 05/24/23   Harrie Foreman, PA-C  sulfamethoxazole-trimethoprim (BACTRIM DS) 800-160 MG tablet Take 1 tablet by mouth 2 (two) times daily. Start the day prior to foley removal appointment 05/24/23   Harrie Foreman, PA-C  tadalafil (CIALIS) 5 MG tablet Take 5 mg by mouth daily. 08/03/22   [provider]  traMADol (ULTRAM) 50 MG tablet Take 1-2 tablets (50-100 mg total) by mouth every 6 (six) hours as needed for moderate pain (pain score 4-6) or severe pain (pain score 7-10). 05/24/23   Harrie Foreman, PA-C    Family History Family History  Problem Relation Age of Onset   Hypertension Mother    Hyperlipidemia Mother    Colon polyps Mother    Brain cancer Father    Healthy Sister    Colon cancer Maternal Grandmother    Prostate cancer Paternal Uncle    Cancer - Prostate Paternal Uncle    Cancer - Prostate Paternal Uncle     Social History Social History   Tobacco Use   Smoking status: Former    Current packs/day: 0.00    Average packs/day: 1 pack/day for 17.0 years (17.0 ttl pk-yrs)    Types: Cigarettes    Start date: 03/05/1987  Quit date: 03/04/2004    Years since quitting: 19.4    Passive exposure: Never   Smokeless tobacco: Former    Types: Snuff    Quit date: 05/08/2020   Tobacco comments:    started snuff in 2019  Vaping Use   Vaping status: Never Used  Substance Use Topics   Alcohol use: Yes    Alcohol/week: 6.0 standard drinks of alcohol    Types: 6 Cans of beer per week   Drug use: Yes    Types: Marijuana    Comment: Edible     Allergies   Ampicillin   Review of Systems Review of Systems  Skin:  Positive for wound.     Physical Exam Triage Vital Signs ED Triage Vitals [07/30/23 1713]  Encounter Vitals Group     BP 131/84     Systolic BP Percentile      Diastolic BP Percentile       Pulse Rate 93     Resp 16     Temp      Temp src      SpO2 97 %     Weight      Height      Head Circumference      Peak Flow      Pain Score 0     Pain Loc      Pain Education      Exclude from Growth Chart    No data found.  Updated Vital Signs BP 131/84 (BP Location: Right Arm)   Pulse 93   Resp 16   SpO2 97%   Visual Acuity Right Eye Distance:   Left Eye Distance:   Bilateral Distance:    Right Eye Near:   Left Eye Near:    Bilateral Near:     Physical Exam Vitals reviewed.  Constitutional:      Appearance: Normal appearance.  Skin:    General: Skin is warm and dry.     Findings: Laceration present.     Comments: Approximately 0.5 to 1 cm long laceration present to the distal aspect of the right index finger.  Neurological:     General: No focal deficit present.     Mental Status: He is alert and oriented to person, place, and time.  Psychiatric:        Mood and Affect: Mood normal.        Behavior: Behavior normal.        Thought Content: Thought content normal.        Judgment: Judgment normal.      UC Treatments / Results  Labs (all labs ordered are listed, but only abnormal results are displayed) Labs Reviewed - No data to display  EKG   Radiology No results found.  Procedures Laceration Repair  Date/Time: 07/30/2023 5:43 PM  Performed by: Providence Crosby, PA-C Authorized by: Providence Crosby, PA-C   Consent:    Consent obtained:  Verbal   Consent given by:  Patient   Risks, benefits, and alternatives were discussed: yes     Risks discussed:  Infection, pain, retained foreign body, need for additional repair and poor wound healing   Alternatives discussed:  No treatment, delayed treatment and observation Universal protocol:    Procedure explained and questions answered to patient or proxy's satisfaction: yes     Patient identity confirmed:  Verbally with patient Anesthesia:    Anesthesia method:  None Laceration details:    Location:   Finger   Finger  location:  R index finger   Length (cm):  0.5   Depth (mm):  2 Exploration:    Limited defect created (wound extended): no     Hemostasis achieved with:  Tourniquet and direct pressure   Imaging outcome: foreign body not noted   Treatment:    Area cleansed with:  Chlorhexidine and saline   Irrigation solution:  Sterile saline   Irrigation method:  Syringe   Debridement:  None   Undermining:  None Skin repair:    Repair method:  Tissue adhesive Repair type:    Repair type:  Simple Post-procedure details:    Dressing:  Non-adherent dressing and splint for protection   Procedure completion:  Tolerated Comments:     Patient presents with open laceration to the distal end of the right index finger.  Due to the nature of the defect created I am not able to reapproximate the edges and close.  We cleansed the area first with chlorhexidine solution and tap water and then irrigated with 2 syringes of sterile saline.  Tourniquet was applied and Dermabond was applied to the wound.  Patient tolerated this without issue.  Telfa pad was applied and then attached using Coban.  This was then covered with splint to provide protection from potential injury.  (including critical care time)  Medications Ordered in UC Medications - No data to display  Initial Impression / Assessment and Plan / UC Course  I have reviewed the triage vital signs and the nursing notes.  Pertinent labs & imaging results that were available during my care of the patient were reviewed by me and considered in my medical decision making (see chart for details).      Final Clinical Impressions(s) / UC Diagnoses   Final diagnoses:  Laceration of right index finger without foreign body without damage to nail, initial encounter    Patient presents with shallow laceration to the right index finger.  He reports that he was cutting vegetables with a mandolin and accidentally sliced his finger open just before  coming to urgent care.  Physical exam reveals mildly bleeding wound to the distal aspect of the right index finger.  He appears to be neurovascularly intact with cap refill less than 2 seconds and intact sensation.  Range of motion of the finger is intact as well.  Wound does not appear to have foreign object or debris.  Patient most recent Tdap booster was last year so new booster does not appear necessary at this time.  Given the extent of damage to the wound I am not able to approximate the wound edges together and suture so we opted to cleanse the wound with chlorhexidine and tap water and then irrigate with 2 syringes of sterile saline.  Dermabond was then applied to the wound and clean dressing applied after this it dried.  Reviewed with patient home measures to keep the area well-maintained and ensure appropriate healing.  ED and return precautions were reviewed and provided in after visit summary.  Follow-up as needed    Discharge Instructions      You were seen today for a laceration to your right index finger. The area was cleansed using chlorhexidine and tap water and then irrigated using sterile saline.  We applied a product called Dermabond which will essentially coat the wound bed in a glue-like substance to provide protection from contamination and allow the wound to heal on its own.  You can keep the area covered with a gauze and Coban  for the next 12 hours.  If desired you can soak it in warm water to help get the bandage off. As needed you can cover the area with a Band-Aid or gauze and tape per your preference.  Please try to keep the area clean and dry.  You can clean the area gently with warm water and a gentle cleanser or hand soap.  Please do not scrub the area as this could disrupt the Dermabond and reopen the wound. You can use Tylenol as needed for pain management If at any point you start to notice redness, swelling, increased pain, drainage that looks like pus you can return  here or go to your primary care provider for further evaluation and management.      ED Prescriptions   None    PDMP not reviewed this encounter.   Roselind Messier 07/30/23 2106

## 2023-07-30 NOTE — Discharge Instructions (Signed)
 You were seen today for a laceration to your right index finger. The area was cleansed using chlorhexidine and tap water and then irrigated using sterile saline.  We applied a product called Dermabond which will essentially coat the wound bed in a glue-like substance to provide protection from contamination and allow the wound to heal on its own.  You can keep the area covered with a gauze and Coban for the next 12 hours.  If desired you can soak it in warm water to help get the bandage off. As needed you can cover the area with a Band-Aid or gauze and tape per your preference.  Please try to keep the area clean and dry.  You can clean the area gently with warm water and a gentle cleanser or hand soap.  Please do not scrub the area as this could disrupt the Dermabond and reopen the wound. You can use Tylenol as needed for pain management If at any point you start to notice redness, swelling, increased pain, drainage that looks like pus you can return here or go to your primary care provider for further evaluation and management.

## 2023-07-30 NOTE — ED Triage Notes (Signed)
 Patient sliced his right index finger today while cutting carrots.  Patient unsure of last Tdap.  Patient's finger is bleeding.

## 2023-08-03 DIAGNOSIS — F4320 Adjustment disorder, unspecified: Secondary | ICD-10-CM | POA: Diagnosis not present

## 2023-08-25 DIAGNOSIS — C61 Malignant neoplasm of prostate: Secondary | ICD-10-CM | POA: Diagnosis not present

## 2023-08-30 DIAGNOSIS — F4320 Adjustment disorder, unspecified: Secondary | ICD-10-CM | POA: Diagnosis not present

## 2023-09-01 DIAGNOSIS — N393 Stress incontinence (female) (male): Secondary | ICD-10-CM | POA: Diagnosis not present

## 2023-09-01 DIAGNOSIS — N5201 Erectile dysfunction due to arterial insufficiency: Secondary | ICD-10-CM | POA: Diagnosis not present

## 2023-09-01 DIAGNOSIS — C61 Malignant neoplasm of prostate: Secondary | ICD-10-CM | POA: Diagnosis not present

## 2023-09-10 DIAGNOSIS — N5201 Erectile dysfunction due to arterial insufficiency: Secondary | ICD-10-CM | POA: Diagnosis not present

## 2023-09-10 DIAGNOSIS — C61 Malignant neoplasm of prostate: Secondary | ICD-10-CM | POA: Diagnosis not present

## 2023-09-10 DIAGNOSIS — E291 Testicular hypofunction: Secondary | ICD-10-CM | POA: Diagnosis not present

## 2023-09-15 DIAGNOSIS — F4320 Adjustment disorder, unspecified: Secondary | ICD-10-CM | POA: Diagnosis not present

## 2023-09-20 DIAGNOSIS — E291 Testicular hypofunction: Secondary | ICD-10-CM | POA: Diagnosis not present

## 2023-09-28 DIAGNOSIS — E291 Testicular hypofunction: Secondary | ICD-10-CM | POA: Diagnosis not present

## 2023-10-05 ENCOUNTER — Ambulatory Visit: Admission: EM | Admit: 2023-10-05 | Discharge: 2023-10-05 | Disposition: A | Discharge status: Home / Self Care

## 2023-10-05 DIAGNOSIS — M545 Low back pain, unspecified: Secondary | ICD-10-CM

## 2023-10-05 NOTE — Discharge Instructions (Addendum)
 You can use Tylenol  and ibuprofen  as needed for pain relief.  Additionally, you can try ice, heat, and topical creams such as IcyHot. We recommend following up with your primary care provider within 1 week if symptoms are not improving. Physical therapy may be helpful if you are having long-term symptoms. Please return or go to the ED if symptoms are worsening, you have any loss of sensation in the legs, fever, urinary retention/incontinence, or if you have any other concerns.

## 2023-10-05 NOTE — ED Triage Notes (Signed)
 Pt c/o lower back pain since yesterday. Says he strained it in the gym yesterday.

## 2023-10-05 NOTE — ED Provider Notes (Signed)
 [facility]    CSN: [id number] Arrival date & time: 10/05/23  1024      History   Chief Complaint Chief Complaint  Patient presents with   Back Pain    Lower    HPI MANI CELESTIN is a 53 y.o. male.   Patient is a 53 year old male who presents to the urgent care today with concerns of low back pain.  He states that when he was working out at [employer] yesterday, he strained his low back during a lifting motion.  Since then, he has taken some tramadol  and ibuprofen  and ice to that area as well.  He reports that his pain is improving today but that he works driving a forklift and the sitting position is very uncomfortable for him.  He denies any loss of sensation, fever, rash, vomiting, blood in the urine, urinary retention/incontinence, or other concerns at this time.    Past Medical History:  Diagnosis Date   Anxiety    Arthritis    Thumbs   Family history of adverse reaction to anesthesia    Mother does not react well   Hypercholesteremia    Marijuana smoker in remission [facility])    Prostate cancer (HCC)    Seasonal allergies    Varicocele     Patient Active Problem List   Diagnosis Date Noted   Prostate cancer (HCC) 05/24/2023   Zoster 02/18/2023   Insomnia 02/18/2023   Gleason grade group 1 adenocarcinoma of prostate (HCC) 10/17/2021   Prostate cancer managed with active surveillance (HCC) 10/14/2021   PSA elevation 08/19/2021   Arthritis 08/14/2021   Well adult exam 07/27/2021   Snapping hip, left 07/11/2021   Low testosterone  06/11/2021   Varicocele 07/22/2016   Nocturia 07/08/2016   Hypogonadism in male 07/08/2016   Ischial bursitis of right side 07/04/2013    Past Surgical History:  Procedure Laterality Date   ANAL FISSURE REPAIR     HERNIA REPAIR     LYMPHADENECTOMY Bilateral 05/24/2023   Procedure: BILATERAL PELVIC LYMPHADENECTOMY;  Surgeon: Florencio Hunting, MD;  Location: [facility];  Service: Urology;  Laterality: Bilateral;   ROBOT ASSISTED  LAPAROSCOPIC RADICAL PROSTATECTOMY N/A 05/24/2023   Procedure: XI ROBOTIC ASSISTED LAPAROSCOPIC RADICAL PROSTATECTOMY LEVEL 2;  Surgeon: Florencio Hunting, MD;  Location: [facility];  Service: Urology;  Laterality: N/A;  210 MINUTES NEEDED FOR CASE       Home Medications    Prior to Admission medications   Medication Sig Start Date End Date Taking? Authorizing Provider  docusate sodium  (COLACE) 100 MG capsule Take 1 capsule (100 mg total) by mouth 2 (two) times daily. 05/24/23   Carrolyn Clan, PA-C  sulfamethoxazole -trimethoprim  (BACTRIM  DS) 800-160 MG tablet Take 1 tablet by mouth 2 (two) times daily. Start the day prior to foley removal appointment 05/24/23   Carrolyn Clan, PA-C  tadalafil (CIALIS) 5 MG tablet Take 5 mg by mouth daily. 08/03/22   [provider]  traMADol  (ULTRAM ) 50 MG tablet Take 1-2 tablets (50-100 mg total) by mouth every 6 (six) hours as needed for moderate pain (pain score 4-6) or severe pain (pain score 7-10). 05/24/23   Carrolyn Clan, PA-C    Family History Family History  Problem Relation Age of Onset   Hypertension Mother    Hyperlipidemia Mother    Colon polyps Mother    Brain cancer Father    Healthy Sister    Colon cancer Maternal Grandmother    Prostate cancer Paternal Uncle  Cancer - Prostate Paternal Uncle    Cancer - Prostate Paternal Uncle     Social History Social History   Tobacco Use   Smoking status: Former    Current packs/day: 0.00    Average packs/day: 1 pack/day for 17.0 years (17.0 ttl pk-yrs)    Types: Cigarettes    Start date: 03/05/1987    Quit date: 03/04/2004    Years since quitting: 19.6    Passive exposure: Never   Smokeless tobacco: Former    Types: Snuff    Quit date: 05/08/2020   Tobacco comments:    started snuff in 2019  Vaping Use   Vaping status: Never Used  Substance Use Topics   Alcohol use: Yes    Alcohol/week: 6.0 standard drinks of alcohol    Types: 6 Cans of beer per week   Drug use: Yes    Types:  Marijuana    Comment: Edible     Allergies   Ampicillin   Review of Systems Review of Systems See HPI for relevant ROS.  Physical Exam Triage Vital Signs ED Triage Vitals  Encounter Vitals Group     BP 10/05/23 1044 (!) 144/88     Systolic BP Percentile --      Diastolic BP Percentile --      Pulse Rate 10/05/23 1044 62     Resp 10/05/23 1044 17     Temp 10/05/23 1044 98.3 F (36.8 C)     Temp Source 10/05/23 1044 Oral     SpO2 10/05/23 1044 98 %     Weight 10/05/23 1047 158 lb 4.8 oz (71.8 kg)     Height --      Head Circumference --      Peak Flow --      Pain Score 10/05/23 1045 4     Pain Loc --      Pain Education --      Exclude from Growth Chart --    No data found.  Updated Vital Signs BP (!) 144/88 (BP Location: Right Arm)   Pulse 62   Temp 98.3 F (36.8 C) (Oral)   Resp 17   Wt 158 lb 4.8 oz (71.8 kg)   SpO2 98%   BMI 23.38 kg/m   Visual Acuity Right Eye Distance:   Left Eye Distance:   Bilateral Distance:    Right Eye Near:   Left Eye Near:    Bilateral Near:     Physical Exam General: Alert and oriented, well-developed/well-nourished, calm, cooperative, no acute distress HEENT: Normocephalic atraumatic, moist mucous membranes, no scleral icterus, trachea midline Lungs: Speaking full sentences, non-labored respirations, no distress Heart: Regular rate and rhythm Abdomen:  Soft, nondistended Musculoskeletal: Moves all extremities well, full and equal strength bilaterally in the lower extremities, reproduction of pain with left hip flexion against resistance Pulses: 2+ pedal bilaterally Neurologic: Awake, A&O x4, gait normal, full sensation to light touch in the lower extremities Integumentary: Warm, dry, normal for ethnicity, intact, no rash Psychiatric: Appropriate mood & affect  UC Treatments / Results  Labs (all labs ordered are listed, but only abnormal results are displayed) Labs Reviewed - No data to  display  EKG   Radiology No results found.  Procedures Procedures (including critical care time)  Medications Ordered in UC Medications - No data to display  Initial Impression / Assessment and Plan / UC Course  I have reviewed the triage vital signs and the nursing notes.  Pertinent labs & imaging results that  were available during my care of the patient were reviewed by me and considered in my medical decision making (see chart for details).    Presents with low back pain.  Differential diagnosis includes: Strain, sprain, disc herniation, spondylolisthesis, cauda equina, spinal abscess, spinal stenosis, pyelonephritis, nephrolithiasis, AAA, meningitis, malignancy, including other diagnoses.   History obtained from: Patient.  Plan: Presents with back pain. Less likely sciatica as straight leg raise test was negative. No back pain red flags on history or physical. Presentation not consistent with malignancy (lack of history of malignancy, lack of B symptoms), fracture (no trauma, no bony tenderness to palpation), cauda equina (no bowel or urinary incontinence/retention, no saddle anesthesia, no distal weakness), AAA, viscus perforation, osteomyelitis or epidural abscess (no IVDU, vertebral tenderness), renal colic, pyelonephritis (afebrile, no CVAT, no urinary symptoms). Given the clinical picture, no indication for imaging at this time.  Patient can take ibuprofen  and Tylenol  as needed for pain relief.  Patient should follow-up with their primary care provider in the next several days.  Strict return precautions discussed including neurologic symptoms, fever, worsening of symptoms, loss of sensation, urinary incontinence/retention, distal weakness, or if patient has any other concerns.  Disposition: Stable to discharge home.  All questions answered to the best of this examiner's ability. Advised to f/u with PCP for further eval and/or reassessment. Patient/parent [ ]  agrees to  plan.  An appropriate evaluation has been performed, and in my medical judgment there is currently no evidence of an immediate life-threatening or surgical condition. Discharge is therefore indicated at this time.  This document was created using the aid of voice recognition Scientist, clinical (histocompatibility and immunogenetics).  Final Clinical Impressions(s) / UC Diagnoses   Final diagnoses:  Acute bilateral low back pain without sciatica   Discharge Instructions      You can use Tylenol  and ibuprofen  as needed for pain relief.  Additionally, you can try ice, heat, and topical creams such as IcyHot. We recommend following up with your primary care provider within 1 week if symptoms are not improving. Physical therapy may be helpful if you are having long-term symptoms. Please return or go to the ED if symptoms are worsening, you have any loss of sensation in the legs, fever, urinary retention/incontinence, or if you have any other concerns.  ED Prescriptions   None    PDMP not reviewed this encounter.   Edna Gouty, PA-C 10/05/23 1135

## 2023-10-11 DIAGNOSIS — M9903 Segmental and somatic dysfunction of lumbar region: Secondary | ICD-10-CM | POA: Diagnosis not present

## 2023-10-11 DIAGNOSIS — M9901 Segmental and somatic dysfunction of cervical region: Secondary | ICD-10-CM | POA: Diagnosis not present

## 2023-10-11 DIAGNOSIS — M9906 Segmental and somatic dysfunction of lower extremity: Secondary | ICD-10-CM | POA: Diagnosis not present

## 2023-10-11 DIAGNOSIS — M9905 Segmental and somatic dysfunction of pelvic region: Secondary | ICD-10-CM | POA: Diagnosis not present

## 2023-10-11 DIAGNOSIS — M7918 Myalgia, other site: Secondary | ICD-10-CM | POA: Diagnosis not present

## 2023-10-11 DIAGNOSIS — M7542 Impingement syndrome of left shoulder: Secondary | ICD-10-CM | POA: Diagnosis not present

## 2023-10-11 DIAGNOSIS — M545 Low back pain, unspecified: Secondary | ICD-10-CM | POA: Diagnosis not present

## 2023-10-11 DIAGNOSIS — M9902 Segmental and somatic dysfunction of thoracic region: Secondary | ICD-10-CM | POA: Diagnosis not present

## 2023-10-13 DIAGNOSIS — F4320 Adjustment disorder, unspecified: Secondary | ICD-10-CM | POA: Diagnosis not present

## 2023-10-27 DIAGNOSIS — F4320 Adjustment disorder, unspecified: Secondary | ICD-10-CM | POA: Diagnosis not present

## 2023-11-01 DIAGNOSIS — M546 Pain in thoracic spine: Secondary | ICD-10-CM | POA: Diagnosis not present

## 2023-11-01 DIAGNOSIS — M7918 Myalgia, other site: Secondary | ICD-10-CM | POA: Diagnosis not present

## 2023-11-01 DIAGNOSIS — M47812 Spondylosis without myelopathy or radiculopathy, cervical region: Secondary | ICD-10-CM | POA: Diagnosis not present

## 2023-11-01 DIAGNOSIS — M9903 Segmental and somatic dysfunction of lumbar region: Secondary | ICD-10-CM | POA: Diagnosis not present

## 2023-11-01 DIAGNOSIS — M9906 Segmental and somatic dysfunction of lower extremity: Secondary | ICD-10-CM | POA: Diagnosis not present

## 2023-11-01 DIAGNOSIS — M9901 Segmental and somatic dysfunction of cervical region: Secondary | ICD-10-CM | POA: Diagnosis not present

## 2023-11-01 DIAGNOSIS — M7542 Impingement syndrome of left shoulder: Secondary | ICD-10-CM | POA: Diagnosis not present

## 2023-11-08 DIAGNOSIS — F4320 Adjustment disorder, unspecified: Secondary | ICD-10-CM | POA: Diagnosis not present

## 2023-11-12 DIAGNOSIS — E291 Testicular hypofunction: Secondary | ICD-10-CM | POA: Diagnosis not present

## 2023-12-01 DIAGNOSIS — F4320 Adjustment disorder, unspecified: Secondary | ICD-10-CM | POA: Diagnosis not present

## 2023-12-06 DIAGNOSIS — M47812 Spondylosis without myelopathy or radiculopathy, cervical region: Secondary | ICD-10-CM | POA: Diagnosis not present

## 2023-12-06 DIAGNOSIS — M7918 Myalgia, other site: Secondary | ICD-10-CM | POA: Diagnosis not present

## 2023-12-06 DIAGNOSIS — M9901 Segmental and somatic dysfunction of cervical region: Secondary | ICD-10-CM | POA: Diagnosis not present

## 2023-12-06 DIAGNOSIS — M546 Pain in thoracic spine: Secondary | ICD-10-CM | POA: Diagnosis not present

## 2023-12-06 DIAGNOSIS — M7542 Impingement syndrome of left shoulder: Secondary | ICD-10-CM | POA: Diagnosis not present

## 2023-12-06 DIAGNOSIS — M9903 Segmental and somatic dysfunction of lumbar region: Secondary | ICD-10-CM | POA: Diagnosis not present

## 2023-12-09 DIAGNOSIS — E291 Testicular hypofunction: Secondary | ICD-10-CM | POA: Diagnosis not present

## 2023-12-09 DIAGNOSIS — N5201 Erectile dysfunction due to arterial insufficiency: Secondary | ICD-10-CM | POA: Diagnosis not present

## 2023-12-17 IMAGING — DX DG LUMBAR SPINE COMPLETE 4+V
6 series · 6 of 6 positions shown · non-contrast
Comparison: None.

CLINICAL DATA: Low back pain, fell wall exercising

EXAM:
LUMBAR SPINE - COMPLETE 4+ VIEW

[l-spine ap]
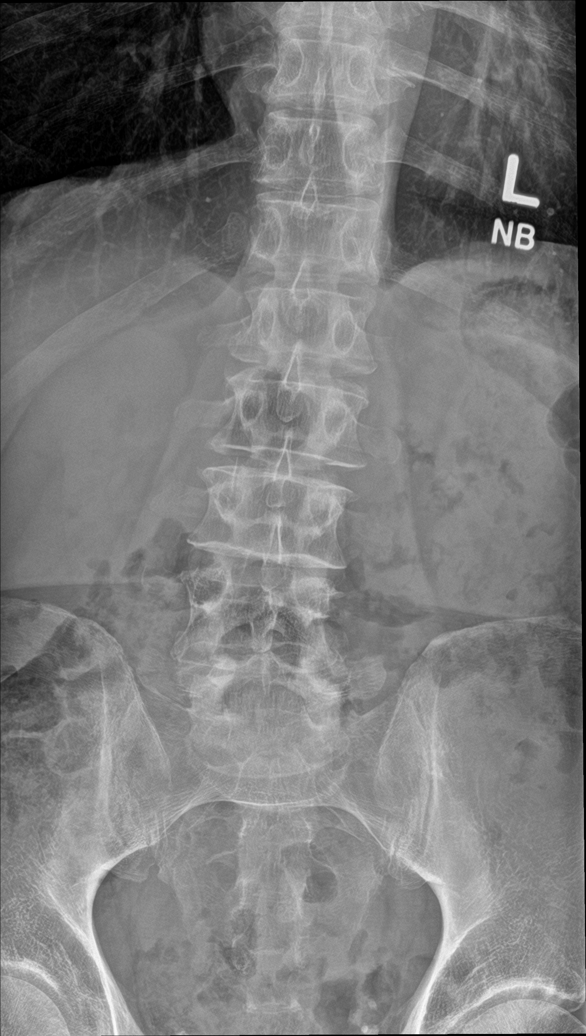

[l-spine obl (1 of 2)]
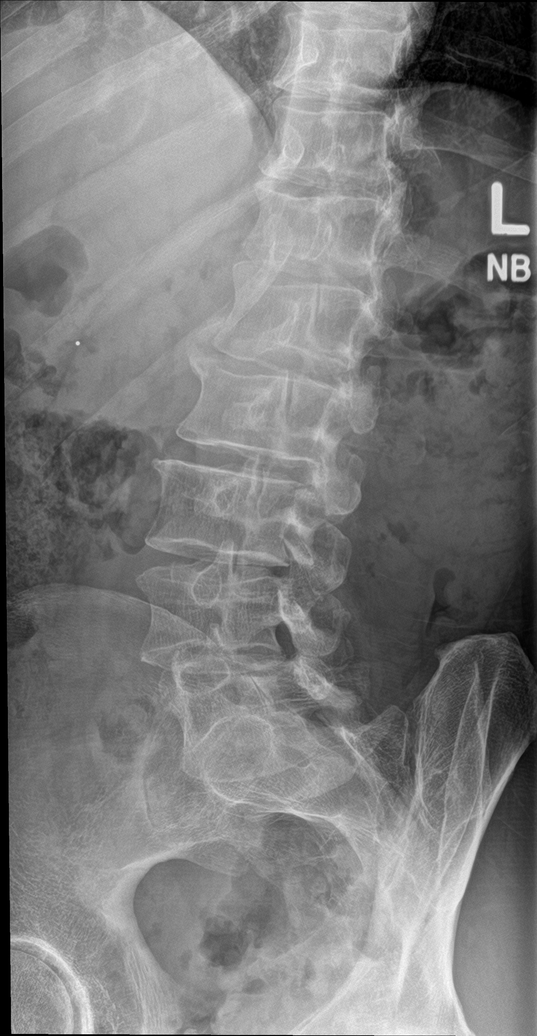

[l-spine obl (2 of 2)]
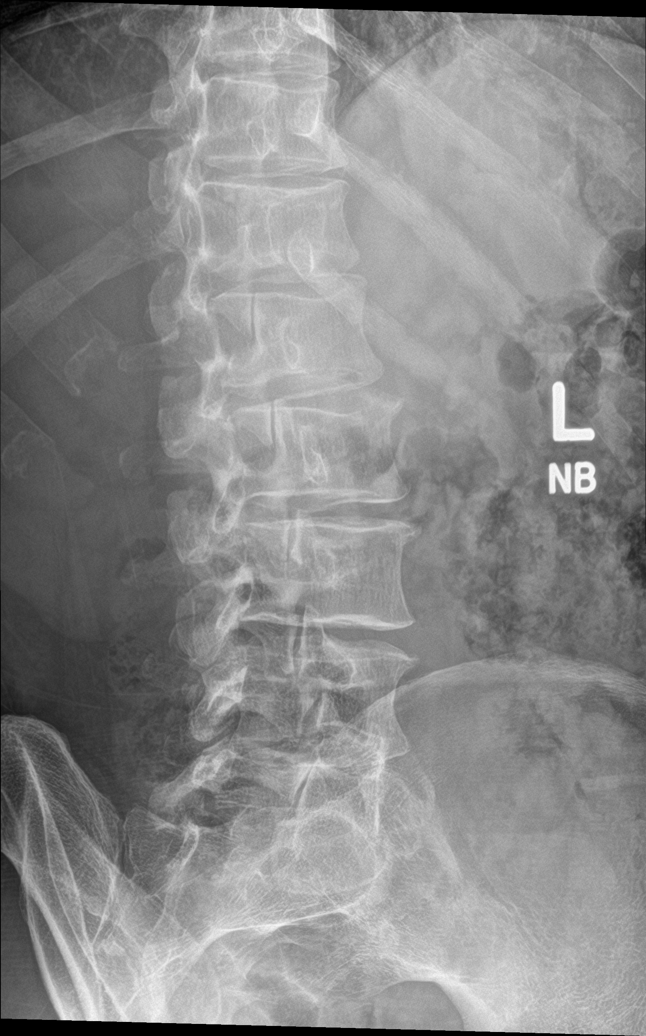

[l-spine lat]
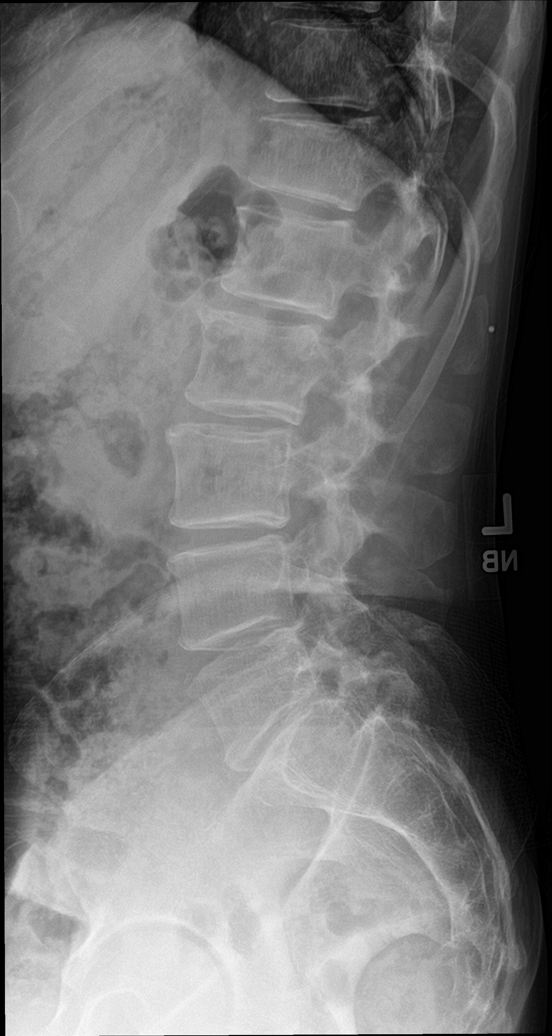

[l-spine spot (1 of 2)]
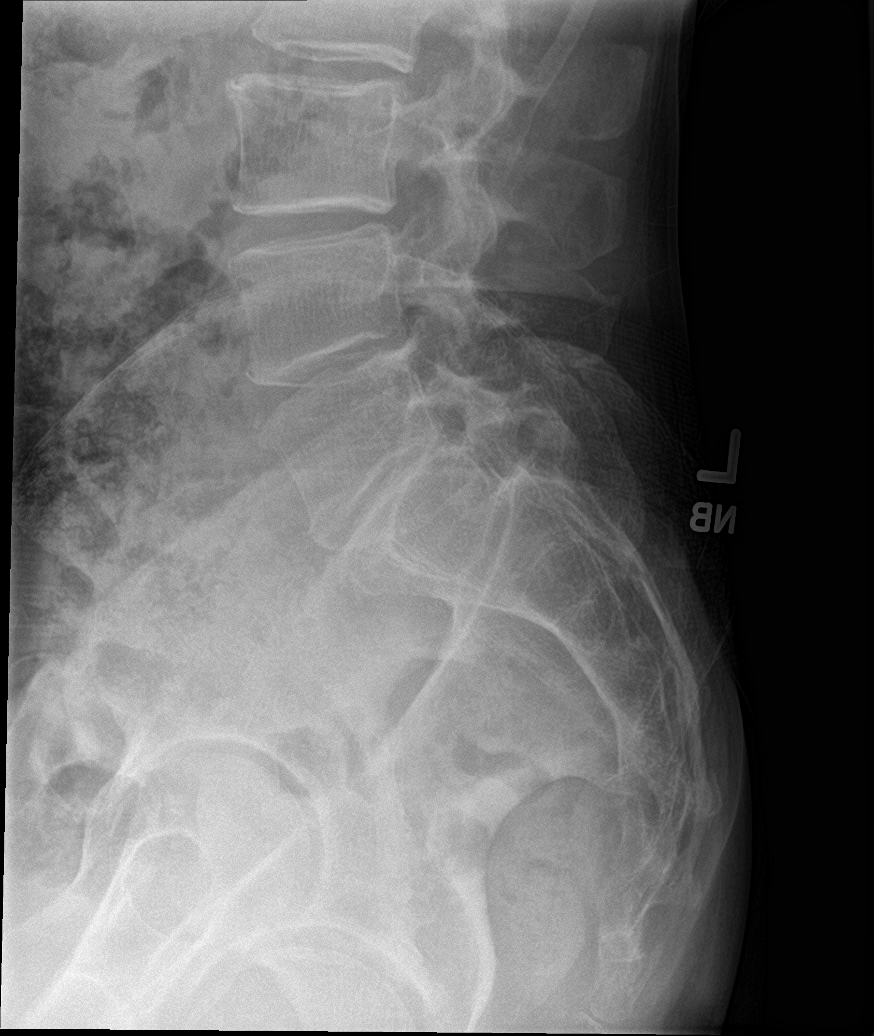

[l-spine spot (2 of 2)]
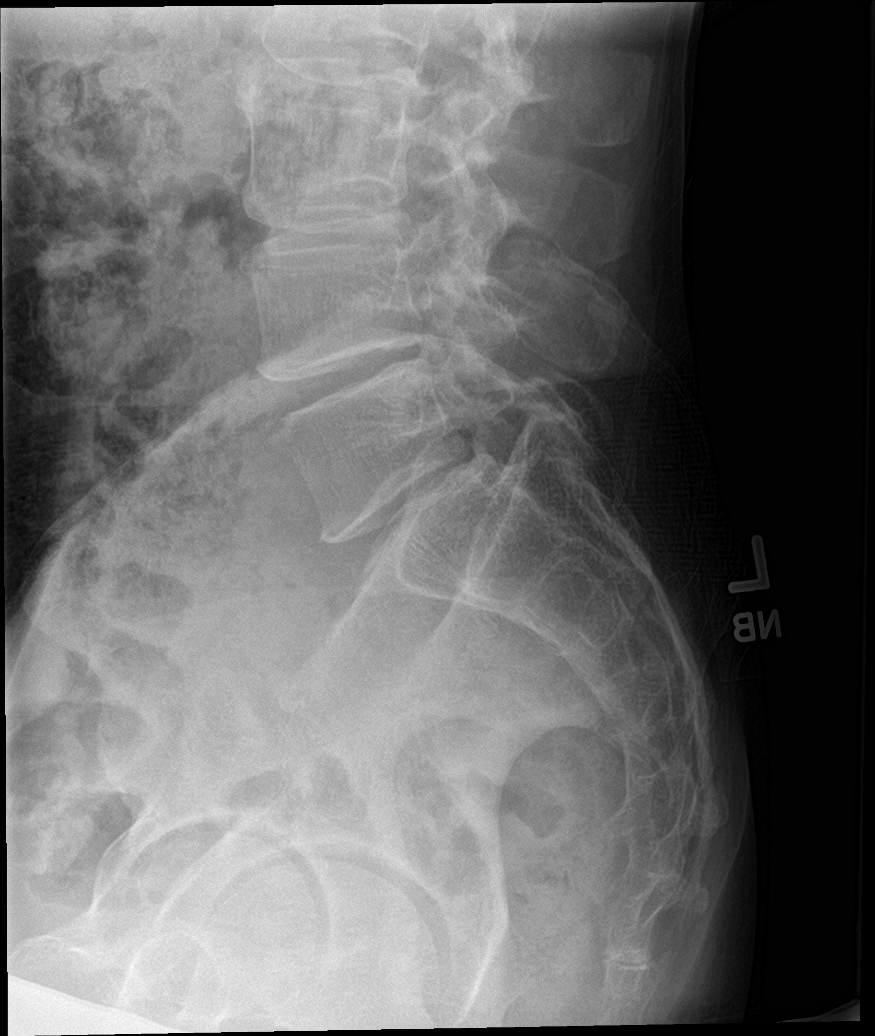

[6 of 6 positions shown; findings below may reference images not displayed]

FINDINGS: Frontal, bilateral oblique, and lateral views of the lumbar spine
are obtained. There are 5 non-rib-bearing lumbar type vertebral
bodies, with minimal right convex curvature centered at L3-4.
Otherwise alignment is anatomic.

There are no acute displaced fractures. Multilevel spondylosis and
facet hypertrophy, with disc space narrowing greatest at L4-5 and
L5-S1. Sacroiliac joints are unremarkable.
IMPRESSION: 1. Mild multilevel spondylosis and facet hypertrophy.
2. No acute displaced fracture.

## 2023-12-22 DIAGNOSIS — F4320 Adjustment disorder, unspecified: Secondary | ICD-10-CM | POA: Diagnosis not present

## 2023-12-28 DIAGNOSIS — M546 Pain in thoracic spine: Secondary | ICD-10-CM | POA: Diagnosis not present

## 2023-12-28 DIAGNOSIS — M7918 Myalgia, other site: Secondary | ICD-10-CM | POA: Diagnosis not present

## 2023-12-28 DIAGNOSIS — M9901 Segmental and somatic dysfunction of cervical region: Secondary | ICD-10-CM | POA: Diagnosis not present

## 2023-12-28 DIAGNOSIS — M7542 Impingement syndrome of left shoulder: Secondary | ICD-10-CM | POA: Diagnosis not present

## 2023-12-28 DIAGNOSIS — M9903 Segmental and somatic dysfunction of lumbar region: Secondary | ICD-10-CM | POA: Diagnosis not present

## 2023-12-28 DIAGNOSIS — M47812 Spondylosis without myelopathy or radiculopathy, cervical region: Secondary | ICD-10-CM | POA: Diagnosis not present

## 2024-01-04 ENCOUNTER — Encounter: Payer: Self-pay | Admitting: Sports Medicine

## 2024-01-07 DIAGNOSIS — M79672 Pain in left foot: Secondary | ICD-10-CM | POA: Diagnosis not present

## 2024-01-12 DIAGNOSIS — F4320 Adjustment disorder, unspecified: Secondary | ICD-10-CM | POA: Diagnosis not present

## 2024-01-17 DIAGNOSIS — M9903 Segmental and somatic dysfunction of lumbar region: Secondary | ICD-10-CM | POA: Diagnosis not present

## 2024-01-17 DIAGNOSIS — M545 Low back pain, unspecified: Secondary | ICD-10-CM | POA: Diagnosis not present

## 2024-01-17 DIAGNOSIS — M9906 Segmental and somatic dysfunction of lower extremity: Secondary | ICD-10-CM | POA: Diagnosis not present

## 2024-01-17 DIAGNOSIS — M9901 Segmental and somatic dysfunction of cervical region: Secondary | ICD-10-CM | POA: Diagnosis not present

## 2024-01-17 DIAGNOSIS — M9905 Segmental and somatic dysfunction of pelvic region: Secondary | ICD-10-CM | POA: Diagnosis not present

## 2024-01-17 DIAGNOSIS — M7918 Myalgia, other site: Secondary | ICD-10-CM | POA: Diagnosis not present

## 2024-01-17 DIAGNOSIS — M9902 Segmental and somatic dysfunction of thoracic region: Secondary | ICD-10-CM | POA: Diagnosis not present

## 2024-01-17 DIAGNOSIS — M9904 Segmental and somatic dysfunction of sacral region: Secondary | ICD-10-CM | POA: Diagnosis not present

## 2024-01-17 DIAGNOSIS — M7542 Impingement syndrome of left shoulder: Secondary | ICD-10-CM | POA: Diagnosis not present

## 2024-02-15 DIAGNOSIS — M546 Pain in thoracic spine: Secondary | ICD-10-CM | POA: Diagnosis not present

## 2024-02-15 DIAGNOSIS — M47812 Spondylosis without myelopathy or radiculopathy, cervical region: Secondary | ICD-10-CM | POA: Diagnosis not present

## 2024-02-15 DIAGNOSIS — M7542 Impingement syndrome of left shoulder: Secondary | ICD-10-CM | POA: Diagnosis not present

## 2024-02-15 DIAGNOSIS — M9901 Segmental and somatic dysfunction of cervical region: Secondary | ICD-10-CM | POA: Diagnosis not present

## 2024-02-15 DIAGNOSIS — M7918 Myalgia, other site: Secondary | ICD-10-CM | POA: Diagnosis not present

## 2024-02-15 DIAGNOSIS — M9903 Segmental and somatic dysfunction of lumbar region: Secondary | ICD-10-CM | POA: Diagnosis not present

## 2024-02-23 DIAGNOSIS — F4320 Adjustment disorder, unspecified: Secondary | ICD-10-CM | POA: Diagnosis not present

## 2024-03-03 DIAGNOSIS — E291 Testicular hypofunction: Secondary | ICD-10-CM | POA: Diagnosis not present

## 2024-03-03 DIAGNOSIS — C61 Malignant neoplasm of prostate: Secondary | ICD-10-CM | POA: Diagnosis not present

## 2024-03-06 DIAGNOSIS — M9906 Segmental and somatic dysfunction of lower extremity: Secondary | ICD-10-CM | POA: Diagnosis not present

## 2024-03-06 DIAGNOSIS — M7542 Impingement syndrome of left shoulder: Secondary | ICD-10-CM | POA: Diagnosis not present

## 2024-03-06 DIAGNOSIS — M9903 Segmental and somatic dysfunction of lumbar region: Secondary | ICD-10-CM | POA: Diagnosis not present

## 2024-03-06 DIAGNOSIS — M546 Pain in thoracic spine: Secondary | ICD-10-CM | POA: Diagnosis not present

## 2024-03-06 DIAGNOSIS — M9901 Segmental and somatic dysfunction of cervical region: Secondary | ICD-10-CM | POA: Diagnosis not present

## 2024-03-06 DIAGNOSIS — M7918 Myalgia, other site: Secondary | ICD-10-CM | POA: Diagnosis not present

## 2024-03-06 DIAGNOSIS — M47812 Spondylosis without myelopathy or radiculopathy, cervical region: Secondary | ICD-10-CM | POA: Diagnosis not present

## 2024-03-07 DIAGNOSIS — C61 Malignant neoplasm of prostate: Secondary | ICD-10-CM | POA: Diagnosis not present

## 2024-03-07 DIAGNOSIS — E291 Testicular hypofunction: Secondary | ICD-10-CM | POA: Diagnosis not present

## 2024-03-07 DIAGNOSIS — N5231 Erectile dysfunction following radical prostatectomy: Secondary | ICD-10-CM | POA: Diagnosis not present

## 2024-03-13 DIAGNOSIS — F4320 Adjustment disorder, unspecified: Secondary | ICD-10-CM | POA: Diagnosis not present

## 2024-03-27 DIAGNOSIS — M7542 Impingement syndrome of left shoulder: Secondary | ICD-10-CM | POA: Diagnosis not present

## 2024-03-27 DIAGNOSIS — M7918 Myalgia, other site: Secondary | ICD-10-CM | POA: Diagnosis not present

## 2024-03-27 DIAGNOSIS — M47812 Spondylosis without myelopathy or radiculopathy, cervical region: Secondary | ICD-10-CM | POA: Diagnosis not present

## 2024-03-27 DIAGNOSIS — M9901 Segmental and somatic dysfunction of cervical region: Secondary | ICD-10-CM | POA: Diagnosis not present

## 2024-03-27 DIAGNOSIS — M9903 Segmental and somatic dysfunction of lumbar region: Secondary | ICD-10-CM | POA: Diagnosis not present

## 2024-03-27 DIAGNOSIS — M546 Pain in thoracic spine: Secondary | ICD-10-CM | POA: Diagnosis not present

## 2024-04-17 DIAGNOSIS — M546 Pain in thoracic spine: Secondary | ICD-10-CM | POA: Diagnosis not present

## 2024-04-17 DIAGNOSIS — M9903 Segmental and somatic dysfunction of lumbar region: Secondary | ICD-10-CM | POA: Diagnosis not present

## 2024-04-17 DIAGNOSIS — M9901 Segmental and somatic dysfunction of cervical region: Secondary | ICD-10-CM | POA: Diagnosis not present

## 2024-04-17 DIAGNOSIS — M7918 Myalgia, other site: Secondary | ICD-10-CM | POA: Diagnosis not present

## 2024-04-17 DIAGNOSIS — M9906 Segmental and somatic dysfunction of lower extremity: Secondary | ICD-10-CM | POA: Diagnosis not present

## 2024-04-17 DIAGNOSIS — M7542 Impingement syndrome of left shoulder: Secondary | ICD-10-CM | POA: Diagnosis not present

## 2024-04-17 DIAGNOSIS — M47812 Spondylosis without myelopathy or radiculopathy, cervical region: Secondary | ICD-10-CM | POA: Diagnosis not present

## 2024-05-01 DIAGNOSIS — F4320 Adjustment disorder, unspecified: Secondary | ICD-10-CM | POA: Diagnosis not present
